# Patient Record
Sex: Male | Born: 1995 | Hispanic: No | Marital: Single | Smoking: Never smoker
Health system: Southern US, Community
[De-identification: ages and names within clinical notes are randomized; demographics above are authoritative.]

## PROBLEM LIST (undated history)

## (undated) DIAGNOSIS — J45909 Unspecified asthma, uncomplicated: Secondary | ICD-10-CM

---
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
## 2017-09-04 ENCOUNTER — Emergency Department (HOSPITAL_COMMUNITY): Payer: Self-pay

## 2017-09-04 ENCOUNTER — Encounter (HOSPITAL_COMMUNITY): Payer: Self-pay | Admitting: Emergency Medicine

## 2017-09-04 ENCOUNTER — Emergency Department (HOSPITAL_COMMUNITY)
Admission: EM | Admit: 2017-09-04 | Discharge: 2017-09-04 | Disposition: A | Discharge status: Home / Self Care | Payer: Self-pay | Attending: Emergency Medicine | Admitting: Emergency Medicine

## 2017-09-04 ENCOUNTER — Other Ambulatory Visit: Payer: Self-pay

## 2017-09-04 DIAGNOSIS — J181 Lobar pneumonia, unspecified organism: Secondary | ICD-10-CM

## 2017-09-04 DIAGNOSIS — J189 Pneumonia, unspecified organism: Secondary | ICD-10-CM | POA: Insufficient documentation

## 2017-09-04 DIAGNOSIS — J4521 Mild intermittent asthma with (acute) exacerbation: Secondary | ICD-10-CM | POA: Insufficient documentation

## 2017-09-04 MED ORDER — PREDNISONE 20 MG PO TABS: 60.0000 mg | ORAL_TABLET | Freq: Every day | ORAL | 0 refills | Status: DC

## 2017-09-04 MED ORDER — AZITHROMYCIN 250 MG PO TABS
500.0000 mg | ORAL_TABLET | Freq: Once | ORAL | Status: AC
  Administered 2017-09-04: 500 mg via ORAL
  Filled 2017-09-04: qty 2

## 2017-09-04 MED ORDER — ALBUTEROL SULFATE HFA 108 (90 BASE) MCG/ACT IN AERS
2.0000 | INHALATION_SPRAY | Freq: Once | RESPIRATORY_TRACT | Status: AC
  Administered 2017-09-04: 2 via RESPIRATORY_TRACT
  Filled 2017-09-04: qty 6.7

## 2017-09-04 MED ORDER — ALBUTEROL SULFATE (2.5 MG/3ML) 0.083% IN NEBU
5.0000 mg | INHALATION_SOLUTION | Freq: Once | RESPIRATORY_TRACT | Status: AC
  Administered 2017-09-04: 5 mg via RESPIRATORY_TRACT
  Filled 2017-09-04: qty 6

## 2017-09-04 MED ORDER — AZITHROMYCIN 250 MG PO TABS: 250.0000 mg | ORAL_TABLET | Freq: Every day | ORAL | 0 refills | Status: DC

## 2017-09-04 MED ORDER — PREDNISONE 20 MG PO TABS
60.0000 mg | ORAL_TABLET | Freq: Once | ORAL | Status: AC
Start: 2017-09-04 — End: 2017-09-04
  Administered 2017-09-04: 60 mg via ORAL
  Filled 2017-09-04: qty 3

## 2017-09-04 NOTE — ED Triage Notes (Signed)
Pt reports having SOB x 1 week worsening with seasonal allergies. Pt states he does not have a rescue inhaler at home. Audibly wheezing in triage.

## 2017-09-04 NOTE — ED Provider Notes (Signed)
TIME SEEN: 6:04 AM  CHIEF COMPLAINT: Shortness of breath, wheezing, cough  HPI: Patient is a 22 year old male with history of asthma who presents to the emergency department with a day of productive cough, shortness of breath, wheezing.  No fevers or chills.  Given breathing treatment in triage and states he is now feeling better.  No sick contacts or recent travel.  ROS: See HPI Constitutional: no fever  Eyes: no drainage  ENT: no runny nose   Cardiovascular:  no chest pain  Resp:  SOB  GI: no vomiting GU: no dysuria Integumentary: no rash  Allergy: no hives  Musculoskeletal: no leg swelling  Neurological: no slurred speech ROS otherwise negative  PAST MEDICAL HISTORY/PAST SURGICAL HISTORY:  History reviewed. No pertinent past medical history.  MEDICATIONS:  Prior to Admission medications   Medication Sig Start Date End Date Taking? Authorizing Provider  albuterol (PROVENTIL HFA;VENTOLIN HFA) 108 (90 BASE) MCG/ACT inhaler Inhale 2 puffs into the lungs every 6 (six) hours as needed for wheezing or shortness of breath. 08/18/14   Rodolph Bong, MD  predniSONE (DELTASONE) 50 MG tablet Take 1 tablet (50 mg total) by mouth daily. 08/18/14   Rodolph Bong, MD    ALLERGIES:  No Known Allergies  SOCIAL HISTORY:  Social History   Tobacco Use  . Smoking status: Never Smoker  . Smokeless tobacco: Never Used  Substance Use Topics  . Alcohol use: No    FAMILY HISTORY: No family history on file.  EXAM: BP 125/83 (BP Location: Left Arm)   Pulse 66   Temp 98.1 F (36.7 C) (Oral)   Resp 18   Ht 5\' 11"  (1.803 m)   Wt 77.1 kg (170 lb)   SpO2 99%   BMI 23.71 kg/m  CONSTITUTIONAL: Alert and oriented and responds appropriately to questions. Well-appearing; well-nourished HEAD: Normocephalic EYES: Conjunctivae clear, pupils appear equal, EOMI ENT: normal nose; moist mucous membranes NECK: Supple, no meningismus, no nuchal rigidity, no LAD  CARD: RRR; S1 and S2 appreciated; no  murmurs, no clicks, no rubs, no gallops RESP: Normal chest excursion without splinting or tachypnea; breath sounds clear and equal bilaterally; no wheezes, no rhonchi, no rales, no hypoxia or respiratory distress, speaking full sentences ABD/GI: Normal bowel sounds; non-distended; soft, non-tender, no rebound, no guarding, no peritoneal signs, no hepatosplenomegaly BACK:  The back appears normal and is non-tender to palpation, there is no CVA tenderness EXT: Normal ROM in all joints; non-tender to palpation; no edema; normal capillary refill; no cyanosis, no calf tenderness or swelling    SKIN: Normal color for age and race; warm; no rash NEURO: Moves all extremities equally PSYCH: The patient's mood and manner are appropriate. Grooming and personal hygiene are appropriate.  MEDICAL DECISION MAKING: Patient here with mild asthma exacerbation.  Given breathing treatment in triage and now lungs are clear and he is asymptomatic.  Chest x-rays was obtained that shows a mild medial right basilar pneumonia.  Will treat with azithromycin.  Will discharge with prednisone burst.  Given albuterol inhaler to take home and instructions on how to use it.  He is otherwise very well-appearing without hypoxia or respiratory distress.  I feel he is safe for discharge home.  Doubt ACS, PE or dissection.  EKG shows no ischemic abnormality.  Given outpatient PCP follow-up information.  At this time, I do not feel there is any life-threatening condition present. I have reviewed and discussed all results (EKG, imaging, lab, urine as appropriate) and exam findings with  patient/family. I have reviewed nursing notes and appropriate previous records.  I feel the patient is safe to be discharged home without further emergent workup and can continue workup as an outpatient as needed. Discussed usual and customary return precautions. Patient/family verbalize understanding and are comfortable with this plan.  Outpatient follow-up has  been provided if needed. All questions have been answered.      EKG Interpretation  Date/Time:  Wednesday September 04 2017 02:23:10 EDT Ventricular Rate:  97 PR Interval:  140 QRS Duration: 82 QT Interval:  344 QTC Calculation: 436 R Axis:   86 Text Interpretation:  Normal sinus rhythm Normal ECG No old tracing to compare Confirmed by Jonta Gastineau, Baxter HireKristen [phone]) on 09/04/2017 5:11:45 AM          Brianna Esson, Layla MawKristen N, DO 09/04/17 [id number]

## 2017-09-04 NOTE — Discharge Instructions (Signed)
You may use your albuterol inhaler 2-4 puffs every 2-4 hours as needed for shortness of breath and wheezing.  We have given you your first dose of steroids and antibiotics in the emergency department.  You may take your next dose the morning of April 18.  You may alternate Tylenol 1000 mg every 6 hours as needed for pain and fever and Ibuprofen 800 mg every 8 hours as needed for pain and fever.  Please take Ibuprofen with food.  I recommend that you drink between 60-80 ounces of water a day and rest.

## 2017-11-04 ENCOUNTER — Encounter (HOSPITAL_COMMUNITY): Payer: Self-pay | Admitting: Emergency Medicine

## 2017-11-04 ENCOUNTER — Emergency Department (HOSPITAL_COMMUNITY): Payer: No Typology Code available for payment source

## 2017-11-04 ENCOUNTER — Emergency Department (HOSPITAL_COMMUNITY)
Admission: EM | Admit: 2017-11-04 | Discharge: 2017-11-04 | Disposition: A | Discharge status: Home / Self Care | Payer: No Typology Code available for payment source | Attending: Emergency Medicine | Admitting: Emergency Medicine

## 2017-11-04 ENCOUNTER — Other Ambulatory Visit: Payer: Self-pay

## 2017-11-04 DIAGNOSIS — R6884 Jaw pain: Secondary | ICD-10-CM | POA: Insufficient documentation

## 2017-11-04 DIAGNOSIS — J45909 Unspecified asthma, uncomplicated: Secondary | ICD-10-CM | POA: Insufficient documentation

## 2017-11-04 MED ORDER — CYCLOBENZAPRINE HCL 10 MG PO TABS: 10.0000 mg | ORAL_TABLET | Freq: Two times a day (BID) | ORAL | 0 refills | Status: DC | PRN

## 2017-11-04 MED ORDER — IBUPROFEN 800 MG PO TABS: 800.0000 mg | ORAL_TABLET | Freq: Three times a day (TID) | ORAL | 0 refills | Status: DC

## 2017-11-04 NOTE — ED Notes (Signed)
Pt understood dc material. NAD noted. Script given at dc  
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
## 2018-08-22 ENCOUNTER — Other Ambulatory Visit: Payer: Self-pay

## 2018-08-22 ENCOUNTER — Encounter (HOSPITAL_COMMUNITY): Payer: Self-pay

## 2018-08-22 ENCOUNTER — Ambulatory Visit (HOSPITAL_COMMUNITY)
Admission: EM | Admit: 2018-08-22 | Discharge: 2018-08-22 | Disposition: A | Discharge status: Home / Self Care | Payer: BLUE CROSS/BLUE SHIELD | Attending: Family Medicine | Admitting: Family Medicine

## 2018-08-22 DIAGNOSIS — J45909 Unspecified asthma, uncomplicated: Secondary | ICD-10-CM

## 2018-08-22 MED ORDER — ALBUTEROL SULFATE HFA 108 (90 BASE) MCG/ACT IN AERS
1.0000 | INHALATION_SPRAY | RESPIRATORY_TRACT | 2 refills | Status: DC | PRN
Start: 2018-08-22 — End: 2020-01-20

## 2018-08-22 NOTE — ED Provider Notes (Signed)
[facility]    CSN: [id number] Arrival date & time: 08/22/18  1831     History   Chief Complaint No chief complaint on file.   HPI Scott Becker is a 23 y.o. male.   Pt complains of asthma.  Pt is out of albuterol.  Pt request rx for albuterol   The history is provided by the patient. No language interpreter was used.  Cough  Cough characteristics:  Non-productive Sputum characteristics:  Nondescript Severity:  Mild Onset quality:  Gradual Timing:  Constant Relieved by:  Nothing Worsened by:  Nothing Ineffective treatments:  None tried Associated symptoms: no shortness of breath and no sinus congestion     History reviewed. No pertinent past medical history.  Patient Active Problem List   Diagnosis Date Noted  . ASTHMA, INTERMITTENT, MODERATE 08/30/2008  . SHORTNESS OF BREATH 08/30/2008    History reviewed. No pertinent surgical history.     Home Medications    Prior to Admission medications   Medication Sig Start Date End Date Taking? Authorizing Provider  albuterol (PROVENTIL HFA;VENTOLIN HFA) 108 (90 Base) MCG/ACT inhaler Inhale 1 puff into the lungs every 4 (four) hours as needed for wheezing or shortness of breath. Prn 08/22/18   Elson Areas, PA-C  ibuprofen (ADVIL,MOTRIN) 800 MG tablet Take 1 tablet (800 mg total) by mouth 3 (three) times daily. 11/04/17   Roxy Horseman, PA-C    Family History History reviewed. No pertinent family history.  Social History Social History   Tobacco Use  . Smoking status: Never Smoker  . Smokeless tobacco: Never Used  Substance Use Topics  . Alcohol use: No  . Drug use: No     Allergies   Patient has no known allergies.   Review of Systems Review of Systems  Respiratory: Positive for cough. Negative for shortness of breath.   All other systems reviewed and are negative.    Physical Exam Triage Vital Signs ED Triage Vitals  Enc Vitals Group     BP 08/22/18 1847 121/81     Pulse  Rate 08/22/18 1847 91     Resp 08/22/18 1847 18     Temp 08/22/18 1847 98.3 F (36.8 C)     Temp src --      SpO2 08/22/18 1847 95 %     Weight --      Height --      Head Circumference --      Peak Flow --      Pain Score 08/22/18 1849 0     Pain Loc --      Pain Edu? --      Excl. in [place]? --    No data found.  Updated Vital Signs BP 121/81   Pulse 91   Temp 98.3 F (36.8 C)   Resp 18   SpO2 95%   Visual Acuity Right Eye Distance:   Left Eye Distance:   Bilateral Distance:    Right Eye Near:   Left Eye Near:    Bilateral Near:     Physical Exam Vitals signs and nursing note reviewed.  Constitutional:      Appearance: He is well-developed.  HENT:     Head: Normocephalic.     Right Ear: Tympanic membrane normal.     Left Ear: Tympanic membrane normal.     Mouth/Throat:     Mouth: Mucous membranes are moist.  Neck:     Musculoskeletal: Normal range of motion.  Cardiovascular:  Rate and Rhythm: Normal rate.  Pulmonary:     Breath sounds: Wheezing present.  Abdominal:     General: There is no distension.  Musculoskeletal: Normal range of motion.  Skin:    General: Skin is warm.  Neurological:     Mental Status: He is alert and oriented to person, place, and time.  Psychiatric:        Mood and Affect: Mood normal.      UC Treatments / Results  Labs (all labs ordered are listed, but only abnormal results are displayed) Labs Reviewed - No data to display  EKG None  Radiology No results found.  Procedures Procedures (including critical care time)  Medications Ordered in UC Medications - No data to display  Initial Impression / Assessment and Plan / UC Course  I have reviewed the triage vital signs and the nursing notes.  Pertinent labs & imaging results that were available during my care of the patient were reviewed by me and considered in my medical decision making (see chart for details).     Rx for albuterol  Final Clinical  Impressions(s) / UC Diagnoses   Final diagnoses:  Moderate asthma without complication, unspecified whether persistent     Discharge Instructions     Return if any problems.   ED Prescriptions    Medication Sig Dispense Auth. Provider   albuterol (PROVENTIL HFA;VENTOLIN HFA) 108 (90 Base) MCG/ACT inhaler Inhale 1 puff into the lungs every 4 (four) hours as needed for wheezing or shortness of breath. Prn 1 Inhaler Elson Areas, [place]     Controlled Substance Prescriptions [place] Controlled Substance Registry consulted? Not Applicable  An After Visit Summary was printed and given to the patient.    Elson Areas, [place] 08/22/18 1906

## 2018-08-22 NOTE — ED Triage Notes (Signed)
Needs refill for inhaler

## 2018-08-22 NOTE — Discharge Instructions (Addendum)
Return if any problems.

## 2019-02-23 ENCOUNTER — Other Ambulatory Visit: Payer: Self-pay

## 2019-02-23 ENCOUNTER — Ambulatory Visit (HOSPITAL_COMMUNITY)
Admission: EM | Admit: 2019-02-23 | Discharge: 2019-02-23 | Disposition: A | Discharge status: Home / Self Care | Payer: BC Managed Care – PPO | Attending: Emergency Medicine | Admitting: Emergency Medicine

## 2019-02-23 ENCOUNTER — Encounter (HOSPITAL_COMMUNITY): Payer: Self-pay

## 2019-02-23 DIAGNOSIS — J4520 Mild intermittent asthma, uncomplicated: Secondary | ICD-10-CM | POA: Insufficient documentation

## 2019-02-23 DIAGNOSIS — Z0189 Encounter for other specified special examinations: Secondary | ICD-10-CM | POA: Diagnosis present

## 2019-02-23 DIAGNOSIS — Z791 Long term (current) use of non-steroidal anti-inflammatories (NSAID): Secondary | ICD-10-CM | POA: Diagnosis not present

## 2019-02-23 DIAGNOSIS — Z113 Encounter for screening for infections with a predominantly sexual mode of transmission: Secondary | ICD-10-CM | POA: Diagnosis not present

## 2019-02-23 DIAGNOSIS — Z20828 Contact with and (suspected) exposure to other viral communicable diseases: Secondary | ICD-10-CM

## 2019-02-23 NOTE — ED Triage Notes (Addendum)
Pt wants to be covid tested. Pt states he wants to be STD tested as well. Pt states he has no symptoms at all.

## 2019-02-23 NOTE — Discharge Instructions (Addendum)
Your STD tests are pending.  If your test results are positive, we will call you.  You may need treatment and your partner may also need treatment.      Your COVID test is pending.  You should self quarantine until your test result is back and is negative.    Go to the emergency department if you develop high fever, shortness of breath, severe diarrhea, or other concerning symptoms.

## 2019-02-23 NOTE — ED Provider Notes (Signed)
[place]    CSN: [id number] Arrival date & time: 02/23/19  1534      History   Chief Complaint Chief Complaint  Patient presents with  . covid test  . SEXUALLY TRANSMITTED DISEASE    HPI Scott Becker is a 23 y.o. male.   Presents with request for a COVID test and STD testing.  Patient states he believes a previous sexual partner was positive for an STD.  He denies current symptoms but states he had a cough a couple of weeks ago.  He denies fever, chills, sore throat, current cough, shortness of breath, vomiting, diarrhea, penile discharge, dysuria, other symptoms.  The history is provided by the patient.    History reviewed. No pertinent past medical history.  Patient Active Problem List   Diagnosis Date Noted  . ASTHMA, INTERMITTENT, MODERATE 08/30/2008  . SHORTNESS OF BREATH 08/30/2008    History reviewed. No pertinent surgical history.     Home Medications    Prior to Admission medications   Medication Sig Start Date End Date Taking? Authorizing Provider  albuterol (PROVENTIL HFA;VENTOLIN HFA) 108 (90 Base) MCG/ACT inhaler Inhale 1 puff into the lungs every 4 (four) hours as needed for wheezing or shortness of breath. Prn 08/22/18   Fransico Meadow, PA-C  ibuprofen (ADVIL,MOTRIN) 800 MG tablet Take 1 tablet (800 mg total) by mouth 3 (three) times daily. 11/04/17   Montine Circle, PA-C    Family History History reviewed. No pertinent family history.  Social History Social History   Tobacco Use  . Smoking status: Never Smoker  . Smokeless tobacco: Never Used  Substance Use Topics  . Alcohol use: Yes  . Drug use: No     Allergies   Patient has no known allergies.   Review of Systems Review of Systems  Constitutional: Negative for chills and fever.  HENT: Negative for congestion, ear pain, rhinorrhea and sore throat.   Eyes: Negative for pain and visual disturbance.  Respiratory: Negative for cough and shortness of breath.    Cardiovascular: Negative for chest pain and palpitations.  Gastrointestinal: Negative for abdominal pain, diarrhea and vomiting.  Genitourinary: Negative for discharge, dysuria, flank pain, hematuria and testicular pain.  Musculoskeletal: Negative for arthralgias and back pain.  Skin: Negative for color change and rash.  Neurological: Negative for seizures and syncope.  All other systems reviewed and are negative.    Physical Exam Triage Vital Signs ED Triage Vitals  Enc Vitals Group     BP 02/23/19 1619 (!) 127/49     Pulse Rate 02/23/19 1619 69     Resp 02/23/19 1619 17     Temp 02/23/19 1619 97.8 F (36.6 C)     Temp Source 02/23/19 1619 Tympanic     SpO2 02/23/19 1619 100 %     Weight 02/23/19 1628 165 lb (74.8 kg)     Height --      Head Circumference --      Peak Flow --      Pain Score 02/23/19 1628 0     Pain Loc --      Pain Edu? --      Excl. in [place]? --    No data found.  Updated Vital Signs BP (!) 127/49 (BP Location: Left Arm)   Pulse 69   Temp 97.8 F (36.6 C) (Tympanic)   Resp 17   Wt 165 lb (74.8 kg)   SpO2 100%   BMI 23.01 kg/m   Visual Acuity  Right Eye Distance:   Left Eye Distance:   Bilateral Distance:    Right Eye Near:   Left Eye Near:    Bilateral Near:     Physical Exam Vitals signs and nursing note reviewed.  Constitutional:      Appearance: He is well-developed.  HENT:     Head: Normocephalic and atraumatic.     Right Ear: Tympanic membrane normal.     Left Ear: Tympanic membrane normal.     Nose: Nose normal.     Mouth/Throat:     Mouth: Mucous membranes are moist.     Pharynx: Oropharynx is clear.  Eyes:     Conjunctiva/sclera: Conjunctivae normal.  Neck:     Musculoskeletal: Neck supple.  Cardiovascular:     Rate and Rhythm: Normal rate and regular rhythm.     Heart sounds: No murmur.  Pulmonary:     Effort: Pulmonary effort is normal. No respiratory distress.     Breath sounds: Normal breath sounds.  Abdominal:      General: Bowel sounds are normal.     Palpations: Abdomen is soft.     Tenderness: There is no abdominal tenderness. There is no right CVA tenderness, left CVA tenderness, guarding or rebound.  Genitourinary:    Penis: Normal.      Scrotum/Testes: Normal.  Skin:    General: Skin is warm and dry.     Findings: No rash.  Neurological:     Mental Status: He is alert.      UC Treatments / Results  Labs (all labs ordered are listed, but only abnormal results are displayed) Labs Reviewed  NOVEL CORONAVIRUS, NAA (HOSP ORDER, SEND-OUT TO REF LAB; TAT 18-24 HRS)  HIV ANTIBODY (ROUTINE TESTING W REFLEX)  RPR  CYTOLOGY, (ORAL, ANAL, URETHRAL) ANCILLARY ONLY    EKG   Radiology No results found.  Procedures Procedures (including critical care time)  Medications Ordered in UC Medications - No data to display  Initial Impression / Assessment and Plan / UC Course  I have reviewed the triage vital signs and the nursing notes.  Pertinent labs & imaging results that were available during my care of the patient were reviewed by me and considered in my medical decision making (see chart for details).    Patient request for COVID test and for STD testing, including HIV and RPR.  Discussed with patient that he should refrain from sex until his STD test are back.  Discussed that if his test are positive we will call him and he may need treatment at that time.  COVID test performed here.  Instructed patient to self quarantine until his test results are back.  Instructed patient to go to the emergency department if he develops high fever, shortness of breath, severe diarrhea, or other concerning symptoms.  Patient agrees with plan of care.   Final Clinical Impressions(s) / UC Diagnoses   Final diagnoses:  Patient request for diagnostic testing     Discharge Instructions     Your STD tests are pending.  If your test results are positive, we will call you.  You may need treatment and your  partner may also need treatment.      Your COVID test is pending.  You should self quarantine until your test result is back and is negative.    Go to the emergency department if you develop high fever, shortness of breath, severe diarrhea, or other concerning symptoms.        ED Prescriptions  None     PDMP not reviewed this encounter.   Mickie Bailate, Thomasine Klutts H, NP 02/23/19 [phone]

## 2019-02-24 LAB — RPR: RPR Ser Ql: NONREACTIVE

## 2019-02-24 LAB — HIV ANTIBODY (ROUTINE TESTING W REFLEX): HIV Screen 4th Generation wRfx: NONREACTIVE

## 2019-02-25 LAB — CYTOLOGY, (ORAL, ANAL, URETHRAL) ANCILLARY ONLY
Chlamydia: NEGATIVE
Neisseria Gonorrhea: NEGATIVE
Trichomonas: NEGATIVE

## 2019-02-25 LAB — NOVEL CORONAVIRUS, NAA (HOSP ORDER, SEND-OUT TO REF LAB; TAT 18-24 HRS): SARS-CoV-2, NAA: NOT DETECTED

## 2019-08-27 ENCOUNTER — Other Ambulatory Visit: Payer: Self-pay

## 2019-08-27 ENCOUNTER — Ambulatory Visit: Payer: BC Managed Care – PPO | Admitting: Pediatrics

## 2019-08-27 VITALS — BP 136/78 | Ht 71.0 in | Wt 165.0 lb

## 2019-08-27 DIAGNOSIS — M67432 Ganglion, left wrist: Secondary | ICD-10-CM

## 2019-08-27 NOTE — Progress Notes (Signed)
  Scott Becker - 24 y.o. male MRN [id number]  Date of birth: 1996-04-22  SUBJECTIVE:   CC: bump on left wrist  24 year old man presenting with a bump on the dorsal aspect of his left wrist that has been present for the past 3 to 4 weeks and has been gradually getting larger in size.  He works at a [employer], dispensing into vans and notices the bump when he is extending his wrist while lifting a package or if he maxinally extends his wrist  orand it is sometimes causing pain.  No numbness or tingling. He would like it to be drained today if possible.   ROS: No unexpected weight loss, fever, chills, swelling, instability, muscle pain, numbness/tingling, redness, otherwise see HPI   PMHx - Updated and reviewed.  Contributory factors include: Negative PSHx - Updated and reviewed.  Contributory factors include:  Negative FHx - Updated and reviewed.  Contributory factors include:  Negative Social Hx - Updated and reviewed. Contributory factors include: Negative Medications - reviewed   Hand/wrist Exam Left wrist: -Inspection: ~1.5 x 1.5 cm cyst on dorsal aspect of wrist  -Palpation: non-tender to palpation -ROM: Normal range of motion with flexion, extension, radial deviation, ulnar deviation, pronation, supination. Some discomfort with maximal flexion and extension. -Strength: Flexion: 5/5; Extension: 5/5; Radial Deviation: 5/5; Ulnar Deviation: 5/5 -Limb neurovascularly intact  Contralateral Hand/wrist -Inspection: No deformity, no discoloration -Palpation: distal radius: non-tender; Distal ulna: non-tender; scaphoid: non-tender -ROM: Normal range of motion with flexion, extension, radial deviation, ulnar deviation, pronation, supination -Strength: Flexion: 5/5; Extension: 5/5; Radial Deviation: 5/5; Ulnar Deviation: 5/5 -Limb neurovascularly intact  DATA REVIEWED: PCP referral records   PHYSICAL EXAM:  VS: BP:136/78  HR: bpm  TEMP: ( )  RESP:   HT:5\' 11"  (180.3 cm)    WT:165 lb (74.8 kg)  BMI:23.02 PHYSICAL EXAM: Gen: NAD, alert, cooperative with exam, well-appearing HEENT: clear conjunctiva,  CV:  no edema, capillary refill brisk, normal rate Resp: non-labored Skin: no rashes, normal turgor  Neuro: no gross deficits.  Psych:  alert and oriented  ASSESSMENT & PLAN:  Ganglion cyst on dorsum of left wrist interfering with work and causing some discomfort.  Cyst was drained today under ultrasound guidance using 0.5 ml of lidocaine to numb the area and then draining with an 18 gauge needle; able to aspirate ~1 ml of gelatinous fluid. Patient had resolution of symptoms after drainage.  Coban was placed after drainage.  Discussed possibility of cyst recurring and offered a second drainage if this occurred.  If it recurs again after that we would have to discuss possibly surgical options.  Return as needed.  I was the preceptor for this visit and available for immediate consultation Marsa Aris, DO

## 2020-01-20 ENCOUNTER — Ambulatory Visit
Admission: EM | Admit: 2020-01-20 | Discharge: 2020-01-20 | Disposition: A | Discharge status: Home / Self Care | Payer: BC Managed Care – PPO | Attending: Emergency Medicine | Admitting: Emergency Medicine

## 2020-01-20 DIAGNOSIS — J4521 Mild intermittent asthma with (acute) exacerbation: Secondary | ICD-10-CM | POA: Diagnosis not present

## 2020-01-20 DIAGNOSIS — R0602 Shortness of breath: Secondary | ICD-10-CM

## 2020-01-20 HISTORY — DX: Unspecified asthma, uncomplicated: J45.909

## 2020-01-20 MED ORDER — PREDNISONE 50 MG PO TABS: 50.0000 mg | ORAL_TABLET | Freq: Every day | ORAL | 0 refills | Status: DC

## 2020-01-20 MED ORDER — ALBUTEROL SULFATE HFA 108 (90 BASE) MCG/ACT IN AERS: 1.0000 | INHALATION_SPRAY | RESPIRATORY_TRACT | 0 refills | Status: DC | PRN

## 2020-01-20 MED ORDER — AEROCHAMBER PLUS FLO-VU MEDIUM MISC: 1.0000 | Freq: Once | 0 refills | Status: AC

## 2020-01-20 MED ORDER — ALBUTEROL SULFATE HFA 108 (90 BASE) MCG/ACT IN AERS
2.0000 | INHALATION_SPRAY | Freq: Once | RESPIRATORY_TRACT | Status: AC
  Administered 2020-01-20: 2 via RESPIRATORY_TRACT

## 2020-01-20 MED ORDER — DEXAMETHASONE SODIUM PHOSPHATE 10 MG/ML IJ SOLN
10.0000 mg | Freq: Once | INTRAMUSCULAR | Status: AC
  Administered 2020-01-20: 10 mg via INTRAMUSCULAR

## 2020-01-20 NOTE — ED Provider Notes (Signed)
[facility]    CSN: [id number] Arrival date & time: 01/20/20  1807      History   Chief Complaint Chief Complaint  Patient presents with  . Asthma    HPI Scott Becker is a 24 y.o. male  Presenting for asthma exacerbation since today.  Patient Scott Becker wheezing, shortness of breath without prior fever, cough, nasal congestion.  No known sick contacts, did not get Covid vaccine.  Patient out of his inhaler which usually alleviates this.  Has not been hospitalized in the last year for asthma exacerbation.  Denying chest pain, palpitations, lower leg swelling, nausea.   Past Medical History:  Diagnosis Date  . Asthma     Patient Active Problem List   Diagnosis Date Noted  . ASTHMA, INTERMITTENT, MODERATE 08/30/2008  . SHORTNESS OF BREATH 08/30/2008    History reviewed. No pertinent surgical history.     Home Medications    Prior to Admission medications   Medication Sig Start Date End Date Taking? Authorizing Provider  albuterol (VENTOLIN HFA) 108 (90 Base) MCG/ACT inhaler Inhale 1 puff into the lungs every 4 (four) hours as needed for wheezing or shortness of breath. Prn 01/20/20   Hall-Potvin, [place], PA-C  ibuprofen (ADVIL,MOTRIN) 800 MG tablet Take 1 tablet (800 mg total) by mouth 3 (three) times daily. 11/04/17   Roxy Horseman, PA-C  predniSONE (DELTASONE) 50 MG tablet Take 1 tablet (50 mg total) by mouth daily with breakfast. 01/20/20   Hall-Potvin, [place], PA-C  Spacer/Aero-Holding Chambers (AEROCHAMBER PLUS FLO-VU MEDIUM) MISC 1 each by Other route once for 1 dose. 01/20/20 01/20/20  Hall-Potvin, [place], PA-C    Family History History reviewed. No pertinent family history.  Social History Social History   Tobacco Use  . Smoking status: Never Smoker  . Smokeless tobacco: Never Used  Substance Use Topics  . Alcohol use: Yes  . Drug use: No     Allergies   Patient has no known allergies.   Review of Systems As per HPI   Physical  Exam Triage Vital Signs ED Triage Vitals [01/20/20 1931]  Enc Vitals Group     BP 134/72     Pulse Rate 89     Resp 16     Temp (!) 97.4 F (36.3 C)     Temp Source Tympanic     SpO2 96 %     Weight 176 lb 4 oz (79.9 kg)     Height      Head Circumference      Peak Flow      Pain Score      Pain Loc      Pain Edu?      Excl. in [place]?    No data found.  Updated Vital Signs BP 134/72 (BP Location: Right Arm)   Pulse 89   Temp (!) 97.4 F (36.3 C) (Tympanic)   Resp 16   Wt 176 lb 4 oz (79.9 kg)   SpO2 96%   BMI 24.58 kg/m   Visual Acuity Right Eye Distance:   Left Eye Distance:   Bilateral Distance:    Right Eye Near:   Left Eye Near:    Bilateral Near:     Physical Exam Constitutional:      General: He is not in acute distress.    Appearance: He is not toxic-appearing or diaphoretic.  HENT:     Head: Normocephalic and atraumatic.     Mouth/Throat:     Mouth: Mucous membranes are  moist.     Pharynx: Oropharynx is clear.  Eyes:     General: No scleral icterus.    Conjunctiva/sclera: Conjunctivae normal.     Pupils: Pupils are equal, round, and reactive to light.  Neck:     Comments: Trachea midline, negative JVD Cardiovascular:     Rate and Rhythm: Normal rate and regular rhythm.  Pulmonary:     Effort: Pulmonary effort is normal. No respiratory distress.     Breath sounds: Wheezing and rhonchi present.     Comments: Scattered, diffuse.  No prolonged expiratory phase.  Good air entry bilaterally. Musculoskeletal:     Cervical back: Neck supple. No tenderness.  Lymphadenopathy:     Cervical: No cervical adenopathy.  Skin:    Capillary Refill: Capillary refill takes less than 2 seconds.     Coloration: Skin is not jaundiced or pale.     Findings: No rash.  Neurological:     Mental Status: He is alert and oriented to person, place, and time.      UC Treatments / Results  Labs (all labs ordered are listed, but only abnormal results are  displayed) Labs Reviewed  NOVEL CORONAVIRUS, NAA    EKG   Radiology No results found.  Procedures Procedures (including critical care time)  Medications Ordered in UC Medications  dexamethasone (DECADRON) injection 10 mg (10 mg Intramuscular Given 01/20/20 2013)  albuterol (VENTOLIN HFA) 108 (90 Base) MCG/ACT inhaler 2 puff (2 puffs Inhalation Given 01/20/20 2012)    Initial Impression / Assessment and Plan / UC Course  I have reviewed the triage vital signs and the nursing notes.  Pertinent labs & imaging results that were available during my care of the patient were reviewed by me and considered in my medical decision making (see chart for details).     Patient afebrile, nontoxic, with SpO2 96%.  Decadron albuterol given in office: Patient tolerated well.  Covid PCR pending.  Patient to quarantine until results are back.  We will treat supportively as outlined below.  Return precautions discussed, patient verbalized understanding and is agreeable to plan. Final Clinical Impressions(s) / UC Diagnoses   Final diagnoses:  SOB (shortness of breath)  Mild intermittent asthma with acute exacerbation     Discharge Instructions     Your COVID test is pending - it is important to quarantine / isolate at home until your results are back. If you test positive and would like further evaluation for persistent or worsening symptoms, you may schedule an E-visit or virtual (video) visit throughout the [facility] app or website.  PLEASE NOTE: If you develop severe chest pain or shortness of breath please go to the ER or call 9-1-1 for further evaluation --> DO NOT schedule electronic or virtual visits for this. Please call our office for further guidance / recommendations as needed.  For information about the Covid vaccine, please visit [email or web address]    ED Prescriptions    Medication Sig Dispense Auth. Provider   albuterol (VENTOLIN HFA) 108 (90 Base) MCG/ACT inhaler  Inhale 1 puff into the lungs every 4 (four) hours as needed for wheezing or shortness of breath. Prn 18 g Hall-Potvin, [place], PA-C   Spacer/Aero-Holding Chambers (AEROCHAMBER PLUS FLO-VU MEDIUM) MISC 1 each by Other route once for 1 dose. 1 each Hall-Potvin, [place], PA-C   predniSONE (DELTASONE) 50 MG tablet Take 1 tablet (50 mg total) by mouth daily with breakfast. 5 tablet Hall-Potvin, [place], PA-C     PDMP not reviewed this  encounter.   Hall-Potvin, [place], [place] 01/20/20 2021

## 2020-01-20 NOTE — ED Triage Notes (Signed)
Pt c/o asthma exacerbation today with SOB. States out of his inhaler. No distress noted

## 2020-01-20 NOTE — Discharge Instructions (Signed)
Your COVID test is pending - it is important to quarantine / isolate at home until your results are back. °If you test positive and would like further evaluation for persistent or worsening symptoms, you may schedule an E-visit or virtual (video) visit throughout the [facility] My[facility] app or website. ° °PLEASE NOTE: If you develop severe chest pain or shortness of breath please go to the ER or call 9-1-1 for further evaluation --> DO NOT schedule electronic or virtual visits for this. °Please call our office for further guidance / recommendations as needed. ° °For information about the Covid vaccine, please visit [email or web address] °

## 2020-01-23 LAB — NOVEL CORONAVIRUS, NAA: SARS-CoV-2, NAA: NOT DETECTED

## 2020-03-27 ENCOUNTER — Other Ambulatory Visit: Payer: Self-pay

## 2020-03-27 ENCOUNTER — Emergency Department (HOSPITAL_COMMUNITY)
Admission: EM | Admit: 2020-03-27 | Discharge: 2020-03-28 | Disposition: A | Discharge status: Home / Self Care | Payer: BC Managed Care – PPO | Attending: Emergency Medicine | Admitting: Emergency Medicine

## 2020-03-27 ENCOUNTER — Encounter (HOSPITAL_COMMUNITY): Payer: Self-pay

## 2020-03-27 DIAGNOSIS — Z5321 Procedure and treatment not carried out due to patient leaving prior to being seen by health care provider: Secondary | ICD-10-CM | POA: Insufficient documentation

## 2020-03-27 DIAGNOSIS — J45909 Unspecified asthma, uncomplicated: Secondary | ICD-10-CM | POA: Insufficient documentation

## 2020-03-27 MED ORDER — ALBUTEROL SULFATE HFA 108 (90 BASE) MCG/ACT IN AERS
2.0000 | INHALATION_SPRAY | Freq: Once | RESPIRATORY_TRACT | Status: AC
  Administered 2020-03-27: 2 via RESPIRATORY_TRACT
  Filled 2020-03-27: qty 6.7

## 2020-03-27 NOTE — ED Triage Notes (Signed)
Pt states that he has asthma and is unable to find his rescue inhaler, audible wheezing heard.

## 2020-03-28 NOTE — ED Notes (Signed)
Patient stated "I cannot wait 5 hours, I have a time limit, I did not drive my own car here I am going to leave." LWBS moving OTF.

## 2020-03-31 IMAGING — DX DG CHEST 2V
2 series · 2 of 2 positions shown · non-contrast
Comparison: Chest radiograph performed 10/07/2003

CLINICAL DATA: Acute onset of shortness of breath.  Wheezing.

EXAM:
CHEST - 2 VIEW

[chest pa]
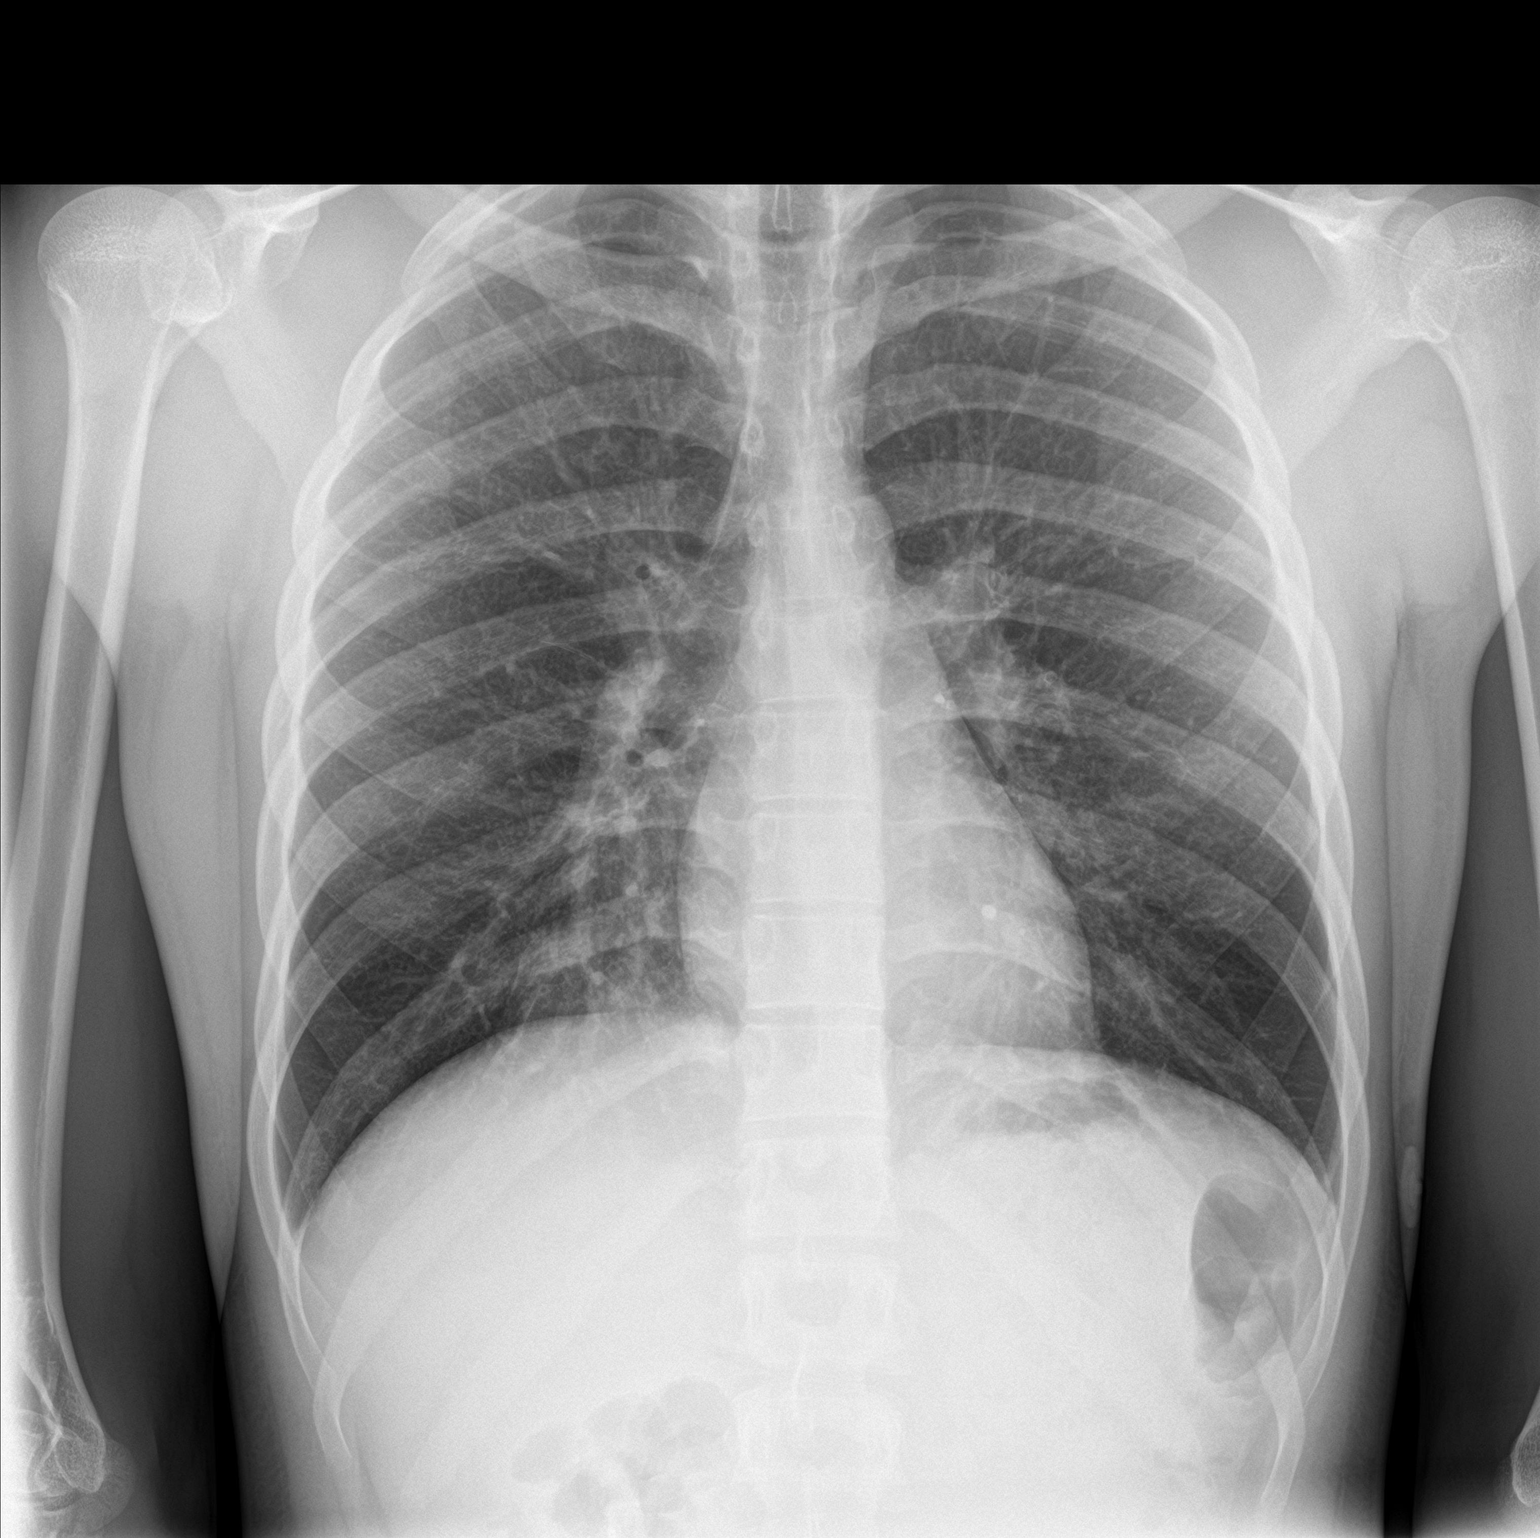

[chest lat]
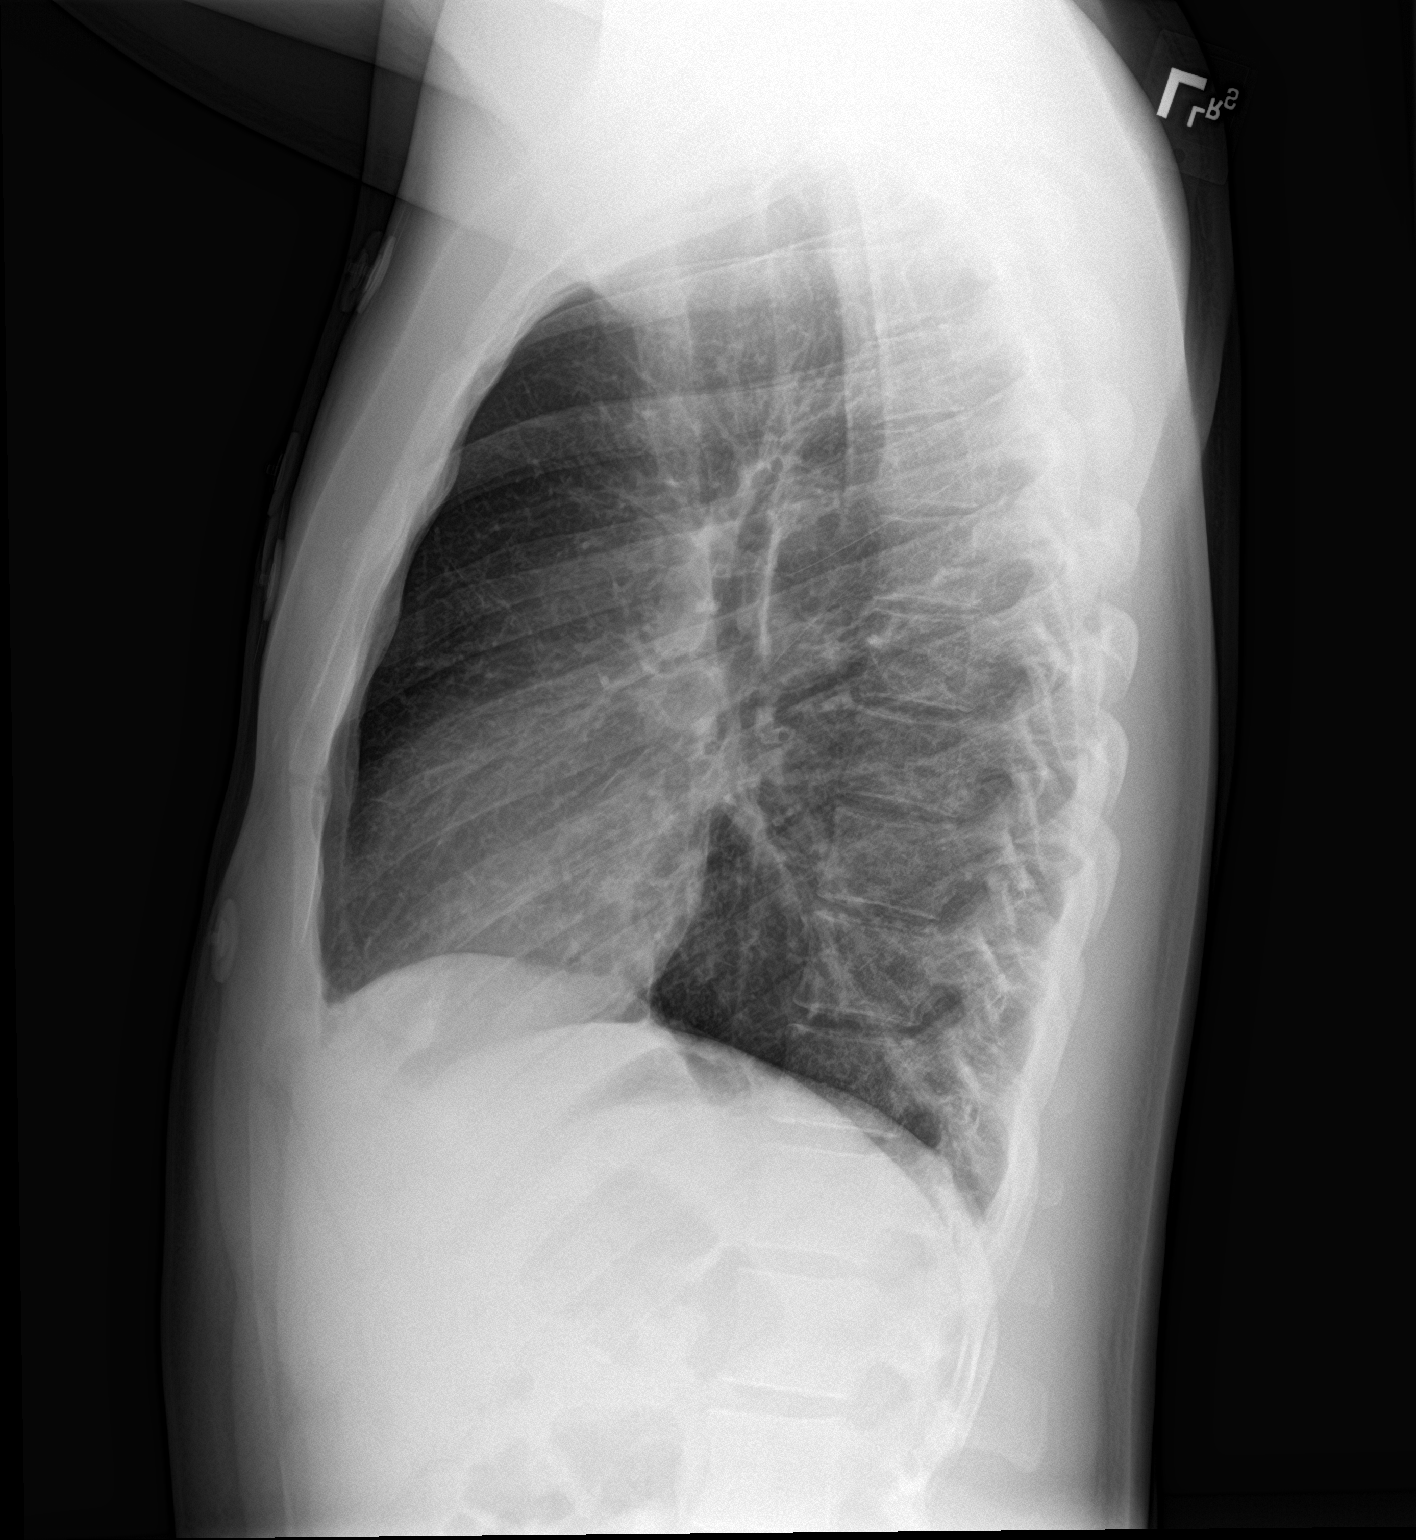

[2 of 2 positions shown; findings below may reference images not displayed]

FINDINGS: The lungs are well-aerated. Mild medial right basilar airspace
opacity is concerning for pneumonia. There is no evidence of pleural
effusion or pneumothorax.

The heart is normal in size; the mediastinal contour is within
normal limits. No acute osseous abnormalities are seen.
IMPRESSION: Mild medial right basilar pneumonia.

## 2021-02-28 ENCOUNTER — Ambulatory Visit
Admission: EM | Admit: 2021-02-28 | Discharge: 2021-02-28 | Disposition: A | Discharge status: Home / Self Care | Payer: Self-pay | Attending: Physician Assistant | Admitting: Physician Assistant

## 2021-02-28 DIAGNOSIS — J45909 Unspecified asthma, uncomplicated: Secondary | ICD-10-CM

## 2021-02-28 MED ORDER — ALBUTEROL SULFATE HFA 108 (90 BASE) MCG/ACT IN AERS
2.0000 | INHALATION_SPRAY | Freq: Once | RESPIRATORY_TRACT | Status: AC
  Administered 2021-02-28: 2 via RESPIRATORY_TRACT

## 2021-02-28 MED ORDER — ALBUTEROL SULFATE HFA 108 (90 BASE) MCG/ACT IN AERS: 1.0000 | INHALATION_SPRAY | Freq: Four times a day (QID) | RESPIRATORY_TRACT | 0 refills | Status: DC | PRN

## 2021-02-28 NOTE — ED Triage Notes (Signed)
Pt c/o SOB onset today, cough. Denies nasal congestion, headache, body aches chills nausea vomiting diarrhea constipation. Audible wheeze on initial assessment. States he does not have refills on his albuterol inhaler.

## 2021-02-28 NOTE — ED Provider Notes (Signed)
[facility]    CSN: [id number] Arrival date & time: 02/28/21  0907      History   Chief Complaint Chief Complaint  Patient presents with   Shortness of Breath    HPI Scott Becker is a 25 y.o. male.   Patient here today for evaluation of shortness of breath, and wheeze that started today.  He has known asthma but ran out of his albuterol inhaler.  He denies any fever or chills.  He has not had any ear pain, sore throat, nasal congestion, nausea, vomiting, or diarrhea.  He has not tried any other treatment for symptoms.  The history is provided by the patient.  Shortness of Breath Associated symptoms: cough   Associated symptoms: no abdominal pain, no ear pain, no fever, no sore throat and no vomiting    Past Medical History:  Diagnosis Date   Asthma     Patient Active Problem List   Diagnosis Date Noted   ASTHMA, INTERMITTENT, MODERATE 08/30/2008   SHORTNESS OF BREATH 08/30/2008    History reviewed. No pertinent surgical history.     Home Medications    Prior to Admission medications   Medication Sig Start Date End Date Taking? Authorizing Provider  albuterol (VENTOLIN HFA) 108 (90 Base) MCG/ACT inhaler Inhale 1-2 puffs into the lungs every 6 (six) hours as needed for wheezing or shortness of breath. 02/28/21  Yes Tomi Bamberger, PA-C  ibuprofen (ADVIL,MOTRIN) 800 MG tablet Take 1 tablet (800 mg total) by mouth 3 (three) times daily. 11/04/17   Roxy Horseman, PA-C  predniSONE (DELTASONE) 50 MG tablet Take 1 tablet (50 mg total) by mouth daily with breakfast. 01/20/20   Hall-Potvin, [place], PA-C    Family History History reviewed. No pertinent family history.  Social History Social History   Tobacco Use   Smoking status: Never   Smokeless tobacco: Never  Substance Use Topics   Alcohol use: Yes   Drug use: No     Allergies   Patient has no known allergies.   Review of Systems Review of Systems  Constitutional:  Negative for  chills and fever.  HENT:  Negative for congestion, ear pain and sore throat.   Eyes:  Negative for discharge and redness.  Respiratory:  Positive for cough and shortness of breath.   Gastrointestinal:  Negative for abdominal pain, diarrhea, nausea and vomiting.    Physical Exam Triage Vital Signs ED Triage Vitals  Enc Vitals Group     BP 02/28/21 0927 110/75     Pulse Rate 02/28/21 0927 86     Resp 02/28/21 0927 18     Temp 02/28/21 0927 98 F (36.7 C)     Temp Source 02/28/21 0927 Oral     SpO2 02/28/21 0927 96 %     Weight --      Height --      Head Circumference --      Peak Flow --      Pain Score 02/28/21 0929 0     Pain Loc --      Pain Edu? --      Excl. in GC? --    No data found.  Updated Vital Signs BP 110/75 (BP Location: Left Arm)   Pulse 86   Temp 98 F (36.7 C) (Oral)   Resp 18   SpO2 96%   Physical Exam Vitals and nursing note reviewed.  Constitutional:      General: He is not in acute distress.  Appearance: He is well-developed. He is not ill-appearing.  HENT:     Head: Normocephalic and atraumatic.     Right Ear: Tympanic membrane normal.     Left Ear: Tympanic membrane normal.     Nose: Nose normal.     Mouth/Throat:     Mouth: Mucous membranes are moist.     Pharynx: Oropharynx is clear. No pharyngeal swelling or oropharyngeal exudate.  Eyes:     Conjunctiva/sclera: Conjunctivae normal.  Cardiovascular:     Rate and Rhythm: Normal rate and regular rhythm.     Heart sounds: Normal heart sounds. No murmur heard. Pulmonary:     Effort: Pulmonary effort is normal. No respiratory distress.     Breath sounds: Normal breath sounds. No wheezing, rhonchi or rales.  Skin:    General: Skin is warm and dry.  Neurological:     Mental Status: He is alert.  Psychiatric:        Mood and Affect: Mood normal.        Behavior: Behavior normal.     UC Treatments / Results  Labs (all labs ordered are listed, but only abnormal results are  displayed) Labs Reviewed - No data to display  EKG   Radiology No results found.  Procedures Procedures (including critical care time)  Medications Ordered in UC Medications  albuterol (VENTOLIN HFA) 108 (90 Base) MCG/ACT inhaler 2 puff (2 puffs Inhalation Given 02/28/21 0933)    Initial Impression / Assessment and Plan / UC Course  I have reviewed the triage vital signs and the nursing notes.  Pertinent labs & imaging results that were available during my care of the patient were reviewed by me and considered in my medical decision making (see chart for details).  Suspect likely asthma flare, and albuterol administered in office with significant improvement of wheezing and shortness of breath.  Prescription sent in for same.  Discussed follow-up with a primary care provider, and explained how to establish care with same.  Information given in discharge instructions.  Encouraged follow-up with any further concerns.  Final Clinical Impressions(s) / UC Diagnoses   Final diagnoses:  Moderate asthma, unspecified whether complicated, unspecified whether persistent   Discharge Instructions   None    ED Prescriptions     Medication Sig Dispense Auth. Provider   albuterol (VENTOLIN HFA) 108 (90 Base) MCG/ACT inhaler Inhale 1-2 puffs into the lungs every 6 (six) hours as needed for wheezing or shortness of breath. 8 g Tomi Bamberger, PA-C      PDMP not reviewed this encounter.   Tomi Bamberger, PA-C 02/28/21 [phone]

## 2022-09-04 ENCOUNTER — Encounter (HOSPITAL_COMMUNITY): Payer: Self-pay

## 2022-09-04 ENCOUNTER — Ambulatory Visit (HOSPITAL_COMMUNITY)
Admission: EM | Admit: 2022-09-04 | Discharge: 2022-09-04 | Disposition: A | Discharge status: Home / Self Care | Payer: Medicaid Other | Attending: Family Medicine | Admitting: Family Medicine

## 2022-09-04 DIAGNOSIS — J4521 Mild intermittent asthma with (acute) exacerbation: Secondary | ICD-10-CM | POA: Diagnosis not present

## 2022-09-04 MED ORDER — PREDNISONE 20 MG PO TABS: 40.0000 mg | ORAL_TABLET | Freq: Every day | ORAL | 0 refills | Status: AC

## 2022-09-04 MED ORDER — ALBUTEROL SULFATE HFA 108 (90 BASE) MCG/ACT IN AERS: 2.0000 | INHALATION_SPRAY | RESPIRATORY_TRACT | 0 refills | Status: DC | PRN

## 2022-09-04 NOTE — ED Provider Notes (Signed)
[facility]    CSN: [id number] Arrival date & time: 09/04/22  0815      History   Chief Complaint Chief Complaint  Patient presents with   Asthma    HPI Scott Becker is a 27 y.o. male.    Asthma   Here for shortness of breath that began bothering him last night.  He has wheeze and it is consistent with his asthma.  His asthma often bothers him more in the spring with the pollen.  He has not had any fever or nasal congestion or sore throat.    Past Medical History:  Diagnosis Date   Asthma     Patient Active Problem List   Diagnosis Date Noted   ASTHMA, INTERMITTENT, MODERATE 08/30/2008   SHORTNESS OF BREATH 08/30/2008    History reviewed. No pertinent surgical history.     Home Medications    Prior to Admission medications   Medication Sig Start Date End Date Taking? Authorizing Provider  albuterol (VENTOLIN HFA) 108 (90 Base) MCG/ACT inhaler Inhale 2 puffs into the lungs every 4 (four) hours as needed for wheezing or shortness of breath. 09/04/22  Yes Zenia Resides, MD  predniSONE (DELTASONE) 20 MG tablet Take 2 tablets (40 mg total) by mouth daily with breakfast for 5 days. 09/04/22 09/09/22 Yes Oseias Horsey, Janace Aris, MD    Family History Family History  Family history unknown: Yes    Social History Social History   Tobacco Use   Smoking status: Never   Smokeless tobacco: Never  Vaping Use   Vaping Use: Never used  Substance Use Topics   Alcohol use: Yes   Drug use: No     Allergies   Patient has no known allergies.   Review of Systems Review of Systems   Physical Exam Triage Vital Signs ED Triage Vitals [09/04/22 0859]  Enc Vitals Group     BP 131/83     Pulse Rate 82     Resp 16     Temp 97.8 F (36.6 C)     Temp Source Oral     SpO2 92 %     Weight      Height      Head Circumference      Peak Flow      Pain Score 0     Pain Loc      Pain Edu?      Excl. in GC?    No data found.  Updated Vital  Signs BP 131/83 (BP Location: Right Arm)   Pulse 82   Temp 97.8 F (36.6 C) (Oral)   Resp 16   SpO2 92%   Visual Acuity Right Eye Distance:   Left Eye Distance:   Bilateral Distance:    Right Eye Near:   Left Eye Near:    Bilateral Near:     Physical Exam Vitals reviewed.  Constitutional:      General: He is not in acute distress.    Appearance: He is not ill-appearing, toxic-appearing or diaphoretic.  HENT:     Nose: Nose normal.     Mouth/Throat:     Mouth: Mucous membranes are moist.     Pharynx: No oropharyngeal exudate or posterior oropharyngeal erythema.  Eyes:     Extraocular Movements: Extraocular movements intact.     Conjunctiva/sclera: Conjunctivae normal.     Pupils: Pupils are equal, round, and reactive to light.  Cardiovascular:     Rate and Rhythm: Normal rate and regular  rhythm.     Heart sounds: No murmur heard. Pulmonary:     Effort: No respiratory distress.     Breath sounds: No stridor. No rhonchi or rales.     Comments: There are just a few end expiratory wheezes heard bilaterally in the lower lung fields.  Overall air movement is good  Musculoskeletal:     Cervical back: Neck supple.  Lymphadenopathy:     Cervical: No cervical adenopathy.  Skin:    Coloration: Skin is not jaundiced or pale.  Neurological:     Mental Status: He is alert and oriented to person, place, and time.  Psychiatric:        Behavior: Behavior normal.      UC Treatments / Results  Labs (all labs ordered are listed, but only abnormal results are displayed) Labs Reviewed - No data to display  EKG   Radiology No results found.  Procedures Procedures (including critical care time)  Medications Ordered in UC Medications - No data to display  Initial Impression / Assessment and Plan / UC Course  I have reviewed the triage vital signs and the nursing notes.  Pertinent labs & imaging results that were available during my care of the patient were reviewed by  me and considered in my medical decision making (see chart for details).        Albuterol inhaler and short burst of prednisone are sent in to treat his asthma exacerbation Final Clinical Impressions(s) / UC Diagnoses   Final diagnoses:  Mild intermittent asthma with acute exacerbation     Discharge Instructions      Albuterol inhaler--do 2 puffs every 4 hours as needed for shortness of breath or wheezing  Take prednisone 20 mg--2 daily for 5 days       ED Prescriptions     Medication Sig Dispense Auth. Provider   albuterol (VENTOLIN HFA) 108 (90 Base) MCG/ACT inhaler Inhale 2 puffs into the lungs every 4 (four) hours as needed for wheezing or shortness of breath. 1 each Zenia Resides, MD   predniSONE (DELTASONE) 20 MG tablet Take 2 tablets (40 mg total) by mouth daily with breakfast for 5 days. 10 tablet Marlinda Mike Janace Aris, MD      PDMP not reviewed this encounter.   Zenia Resides, MD 09/04/22 [phone]

## 2022-09-04 NOTE — Discharge Instructions (Signed)
Albuterol inhaler--do 2 puffs every 4 hours as needed for shortness of breath or wheezing  Take prednisone 20 mg--2 daily for 5 days  

## 2022-09-04 NOTE — ED Triage Notes (Signed)
Patient reports that he has had SOB/asthma since yesterday. Patient states was worse during the night. Patient states he does not have an Albuterol inhaler and has ben out for a few months.

## 2022-11-06 ENCOUNTER — Ambulatory Visit (HOSPITAL_COMMUNITY)
Admission: EM | Admit: 2022-11-06 | Discharge: 2022-11-06 | Disposition: A | Discharge status: Home / Self Care | Payer: Medicaid Other | Attending: Emergency Medicine | Admitting: Emergency Medicine

## 2022-11-06 ENCOUNTER — Other Ambulatory Visit: Payer: Self-pay

## 2022-11-06 ENCOUNTER — Encounter (HOSPITAL_COMMUNITY): Payer: Self-pay | Admitting: Emergency Medicine

## 2022-11-06 DIAGNOSIS — J4520 Mild intermittent asthma, uncomplicated: Secondary | ICD-10-CM

## 2022-11-06 DIAGNOSIS — Z760 Encounter for issue of repeat prescription: Secondary | ICD-10-CM | POA: Diagnosis not present

## 2022-11-06 MED ORDER — ALBUTEROL SULFATE HFA 108 (90 BASE) MCG/ACT IN AERS: 2.0000 | INHALATION_SPRAY | RESPIRATORY_TRACT | 0 refills | Status: DC | PRN

## 2022-11-06 NOTE — ED Triage Notes (Signed)
Pt states he ran out his inhaler for asthma and is requesting a refill.

## 2022-11-06 NOTE — Discharge Instructions (Addendum)
I have refilled your albuterol inhaler today.  Our staff will help set you up with a primary care provider for further evaluation and management of your asthma.  Please return to clinic for any new or concerning symptoms.

## 2022-11-06 NOTE — ED Provider Notes (Signed)
[facility]    CSN: [id number] Arrival date & time: 11/06/22  1417      History   Chief Complaint Chief Complaint  Patient presents with   Medication Refill    Pt states he ran out his inhaler for asthma and is requesting a refill.    HPI Scott Becker is a 27 y.o. male.   Patient presents to clinic requesting a refill of his albuterol inhaler.  He has been using it about twice daily, reports he has good control of his symptoms after he uses his inhaler.  He denies any persistent wheezing, shortness of breath or fevers.  Does not smoke.  Does not currently have a PCP.    The history is provided by the patient and medical records.  Medication Refill   Past Medical History:  Diagnosis Date   Asthma     Patient Active Problem List   Diagnosis Date Noted   ASTHMA, INTERMITTENT, MODERATE 08/30/2008   SHORTNESS OF BREATH 08/30/2008    History reviewed. No pertinent surgical history.     Home Medications    Prior to Admission medications   Medication Sig Start Date End Date Taking? Authorizing Provider  albuterol (VENTOLIN HFA) 108 (90 Base) MCG/ACT inhaler Inhale 2 puffs into the lungs every 4 (four) hours as needed for wheezing or shortness of breath. 11/06/22   Bonnie Roig, [place] N, FNP    Family History Family History  Family history unknown: Yes    Social History Social History   Tobacco Use   Smoking status: Never   Smokeless tobacco: Never  Vaping Use   Vaping Use: Never used  Substance Use Topics   Alcohol use: Yes   Drug use: No     Allergies   Patient has no known allergies.   Review of Systems Review of Systems  Constitutional:  Negative for fever.  Respiratory:  Negative for cough, shortness of breath and wheezing.   Cardiovascular:  Negative for chest pain.     Physical Exam Triage Vital Signs ED Triage Vitals  Enc Vitals Group     BP 11/06/22 1429 (!) 140/75     Pulse Rate 11/06/22 1429 91     Resp  11/06/22 1429 18     Temp 11/06/22 1429 98 F (36.7 C)     Temp Source 11/06/22 1429 Oral     SpO2 11/06/22 1429 96 %     Weight 11/06/22 1430 169 lb 15.6 oz (77.1 kg)     Height 11/06/22 1430 5\' 11"  (1.803 m)     Head Circumference --      Peak Flow --      Pain Score --      Pain Loc --      Pain Edu? --      Excl. in GC? --    No data found.  Updated Vital Signs BP (!) 140/75 (BP Location: Right Arm)   Pulse 91   Temp 98 F (36.7 C) (Oral)   Resp 18   Ht 5\' 11"  (1.803 m)   Wt 169 lb 15.6 oz (77.1 kg)   SpO2 96%   BMI 23.71 kg/m   Visual Acuity Right Eye Distance:   Left Eye Distance:   Bilateral Distance:    Right Eye Near:   Left Eye Near:    Bilateral Near:     Physical Exam Vitals and nursing note reviewed.  Constitutional:      Appearance: Normal appearance.  HENT:  Head: Normocephalic and atraumatic.     Right Ear: External ear normal.     Left Ear: External ear normal.     Nose: Nose normal.     Mouth/Throat:     Mouth: Mucous membranes are moist.  Eyes:     Conjunctiva/sclera: Conjunctivae normal.  Cardiovascular:     Rate and Rhythm: Normal rate and regular rhythm.     Heart sounds: Normal heart sounds. No murmur heard. Pulmonary:     Effort: Pulmonary effort is normal. No respiratory distress.     Breath sounds: Normal breath sounds.  Musculoskeletal:        General: Normal range of motion.  Skin:    General: Skin is warm and dry.  Neurological:     General: No focal deficit present.     Mental Status: He is alert.  Psychiatric:        Mood and Affect: Mood normal.      UC Treatments / Results  Labs (all labs ordered are listed, but only abnormal results are displayed) Labs Reviewed - No data to display  EKG   Radiology No results found.  Procedures Procedures (including critical care time)  Medications Ordered in UC Medications - No data to display  Initial Impression / Assessment and Plan / UC Course  I have  reviewed the triage vital signs and the nursing notes.  Pertinent labs & imaging results that were available during my care of the patient were reviewed by me and considered in my medical decision making (see chart for details).  Vitals and triage reviewed, patient is hemodynamically stable.  Lungs are vesicular posteriorly, oxygenation 96%.  Will refill albuterol inhaler, staff to schedule with PCP for further evaluation and management of asthma.  Patient does not appear to be in acute exacerbation, will withhold antibiotics or oral steroids.  Plan of care, follow-up care and return precautions given, no questions at this time.     Final Clinical Impressions(s) / UC Diagnoses   Final diagnoses:  Medication refill  Mild intermittent asthma in adult without complication     Discharge Instructions      I have refilled your albuterol inhaler today.  Our staff will help set you up with a primary care provider for further evaluation and management of your asthma.  Please return to clinic for any new or concerning symptoms.     ED Prescriptions     Medication Sig Dispense Auth. Provider   albuterol (VENTOLIN HFA) 108 (90 Base) MCG/ACT inhaler Inhale 2 puffs into the lungs every 4 (four) hours as needed for wheezing or shortness of breath. 1 each Bennette Hasty, [place] N, FNP      PDMP not reviewed this encounter.   Marilene Vath, [place] N, [place] 11/06/22 [phone]

## 2022-12-12 ENCOUNTER — Ambulatory Visit (INDEPENDENT_AMBULATORY_CARE_PROVIDER_SITE_OTHER): Payer: Medicaid Other | Admitting: Family Medicine

## 2022-12-12 ENCOUNTER — Encounter: Payer: Self-pay | Admitting: Family Medicine

## 2022-12-12 VITALS — BP 108/72 | HR 81 | Temp 98.3°F | Ht 70.5 in | Wt 222.5 lb

## 2022-12-12 DIAGNOSIS — R002 Palpitations: Secondary | ICD-10-CM | POA: Diagnosis not present

## 2022-12-12 DIAGNOSIS — J4540 Moderate persistent asthma, uncomplicated: Secondary | ICD-10-CM | POA: Insufficient documentation

## 2022-12-12 LAB — CBC WITH DIFFERENTIAL/PLATELET
Basophils Absolute: 0 10*3/uL (ref 0.0–0.1)
Basophils Relative: 0.6 % (ref 0.0–3.0)
Eosinophils Absolute: 0.2 10*3/uL (ref 0.0–0.7)
Eosinophils Relative: 3.3 % (ref 0.0–5.0)
HCT: 44.1 % (ref 39.0–52.0)
Hemoglobin: 14.1 g/dL (ref 13.0–17.0)
Lymphocytes Relative: 31.1 % (ref 12.0–46.0)
Lymphs Abs: 2.3 10*3/uL (ref 0.7–4.0)
MCHC: 31.9 g/dL (ref 30.0–36.0)
MCV: 81.1 fl (ref 78.0–100.0)
Monocytes Absolute: 0.6 10*3/uL (ref 0.1–1.0)
Monocytes Relative: 8.4 % (ref 3.0–12.0)
Neutro Abs: 4.1 10*3/uL (ref 1.4–7.7)
Neutrophils Relative %: 56.6 % (ref 43.0–77.0)
Platelets: 416 10*3/uL — ABNORMAL HIGH (ref 150.0–400.0)
RBC: 5.44 Mil/uL (ref 4.22–5.81)
RDW: 14.4 % (ref 11.5–15.5)
WBC: 7.3 10*3/uL (ref 4.0–10.5)

## 2022-12-12 LAB — COMPREHENSIVE METABOLIC PANEL
ALT: 53 U/L (ref 0–53)
AST: 34 U/L (ref 0–37)
Albumin: 4.3 g/dL (ref 3.5–5.2)
Alkaline Phosphatase: 39 U/L (ref 39–117)
BUN: 11 mg/dL (ref 6–23)
CO2: 27 mEq/L (ref 19–32)
Calcium: 9.6 mg/dL (ref 8.4–10.5)
Chloride: 103 mEq/L (ref 96–112)
Creatinine, Ser: 0.96 mg/dL (ref 0.40–1.50)
GFR: 108.44 mL/min (ref >60.00)
Glucose, Bld: 105 mg/dL — ABNORMAL HIGH (ref 70–99)
Potassium: 3.6 mEq/L (ref 3.5–5.1)
Sodium: 139 mEq/L (ref 135–145)
Total Bilirubin: 0.4 mg/dL (ref 0.2–1.2)
Total Protein: 7.4 g/dL (ref 6.0–8.3)

## 2022-12-12 LAB — TSH: TSH: 1.95 u[IU]/mL (ref 0.35–5.50)

## 2022-12-12 MED ORDER — BUDESONIDE-FORMOTEROL FUMARATE 160-4.5 MCG/ACT IN AERO
2.0000 | INHALATION_SPRAY | Freq: Two times a day (BID) | RESPIRATORY_TRACT | 5 refills | Status: DC
Start: 2022-12-12 — End: 2024-02-24

## 2022-12-12 NOTE — Assessment & Plan Note (Signed)
Normal rate and rhythm on auscultation today, likely due to albuterol use, will order labs to rule out other causes.

## 2022-12-12 NOTE — Assessment & Plan Note (Signed)
Concern is that his asthma worsens with exercise, I recommended starting a daily controller medication to help with his breathing, rx for symbicort sent to pharmacy.

## 2022-12-12 NOTE — Progress Notes (Signed)
New Patient Office Visit  Subjective    Patient ID: Scott Becker, male    DOB: 08-13-1995  Age: 27 y.o. MRN: [id number]  CC:  Chief Complaint  Patient presents with   Establish Care    HPI Scott Becker presents to establish care Patient did not have a previous PCP, states that he is feeling fine, but reports that his HR is going faster than normal, no SOB or dizziness. No chest pain, states he just feels like he is more aware of it. Hasn't been going on for very long, can be very random. States that he doesn't think it is beating faster he is just more aware of the heart beat.   Pt reports a history of asthma, states that he is using his inhaler a couple times a week, no chronic coughing or difficulty with breathing, no allergy symptoms. Non smoker, does not have any allergy symptoms or take anything over the counter.he reports that he gets worsening symptoms when he exercises, states that he has had to limit his exercise due to breathing issues.   Pt denies any other medical problems he has been diagnosed with at this time. I have reviewed all aspects of the patient's medical history including social, family, and surgical history.   Current Outpatient Medications  Medication Instructions   albuterol (VENTOLIN HFA) 108 (90 Base) MCG/ACT inhaler 2 puffs, Inhalation, Every 4 hours PRN   budesonide-formoterol (SYMBICORT) 160-4.5 MCG/ACT inhaler 2 puffs, Inhalation, 2 times daily    Past Medical History:  Diagnosis Date   Asthma     History reviewed. No pertinent surgical history.  Family History  Problem Relation Age of Onset   Diabetes Sister    Asthma Maternal Grandmother     Social History   Socioeconomic History   Marital status: Single    Spouse name: Not on file   Number of children: Not on file   Years of education: Not on file   Highest education level: Not on file  Occupational History   Not on file  Tobacco Use   Smoking status: Never    Smokeless tobacco: Never  Vaping Use   Vaping status: Never Used  Substance and Sexual Activity   Alcohol use: Yes    Comment: minimal   Drug use: Not Currently    Types: Marijuana   Sexual activity: Yes  Other Topics Concern   Not on file  Social History Narrative   Not on file   Social Determinants of Health   Financial Resource Strain: Not on File (09/07/2021)   Received from [employer], [employer]    Financial Resource Strain: 0  Food Insecurity: Not on File (09/07/2021)   Received from [place], [facility] Insecurity    Food: 0  Transportation Needs: Not on File (09/07/2021)   Received from [employer] [facility], [employer] Needs    Transportation: 0  Physical Activity: Not on File (09/07/2021)   Received from [place], [place]   Physical Activity    Physical Activity: 0  Stress: Not on File (09/07/2021)   Received from [facility], [place]   Stress    Stress: 0  Social Connections: Not on File (09/07/2021)   Received from [place], [place]   Social Connections    Social Connections and Isolation: 0  Intimate Partner Violence: Not on file    Review of Systems  All other systems reviewed and are negative.       Objective  BP 108/72 (BP Location: Left Arm, Patient Position: Sitting, Cuff Size: Large)   Pulse 81   Temp 98.3 F (36.8 C) (Oral)   Ht 5' 10.5" (1.791 m)   Wt 222 lb 8 oz (100.9 kg)   SpO2 98%   BMI 31.47 kg/m   Physical Exam Vitals reviewed.  Constitutional:      Appearance: Normal appearance. He is well-groomed and normal weight.  HENT:     Head: Normocephalic and atraumatic.  Eyes:     Extraocular Movements: Extraocular movements intact.     Conjunctiva/sclera: Conjunctivae normal.     Pupils: Pupils are equal, round, and reactive to light.  Cardiovascular:     Rate and Rhythm: Normal rate and regular rhythm.     Pulses: Normal pulses.     Heart sounds: S1 normal and S2 normal. No murmur heard. Pulmonary:     Effort: Pulmonary effort  is normal.     Breath sounds: Normal breath sounds and air entry. No rales.  Abdominal:     General: Abdomen is flat. Bowel sounds are normal.     Palpations: Abdomen is soft.     Tenderness: There is no abdominal tenderness.  Musculoskeletal:     Right lower leg: No edema.     Left lower leg: No edema.  Neurological:     General: No focal deficit present.     Mental Status: He is alert and oriented to person, place, and time.     Gait: Gait is intact.     Deep Tendon Reflexes: Reflexes are normal and symmetric.  Psychiatric:        Mood and Affect: Mood and affect normal.         Assessment & Plan:  Heart palpitations Assessment & Plan: Normal rate and rhythm on auscultation today, likely due to albuterol use, will order labs to rule out other causes.   Orders: -     Comprehensive metabolic panel -     TSH -     CBC with Differential/Platelet  Moderate persistent asthma without complication Assessment & Plan: Concern is that his asthma worsens with exercise, I recommended starting a daily controller medication to help with his breathing, rx for symbicort sent to pharmacy.   Orders: -     Budesonide-Formoterol Fumarate; Inhale 2 puffs into the lungs in the morning and at bedtime.  Dispense: 1 each; Refill: 5    Return in about 1 year (around 12/12/2023) for annual physical exam.   Karie Georges, MD

## 2023-02-02 ENCOUNTER — Telehealth: Payer: Medicaid Other | Admitting: Family Medicine

## 2023-02-02 DIAGNOSIS — K649 Unspecified hemorrhoids: Secondary | ICD-10-CM

## 2023-02-02 MED ORDER — HYDROCORTISONE ACETATE 25 MG RE SUPP: 25.0000 mg | Freq: Two times a day (BID) | RECTAL | 0 refills | Status: DC

## 2023-02-02 NOTE — Progress Notes (Signed)

## 2023-02-03 ENCOUNTER — Other Ambulatory Visit: Payer: Self-pay

## 2023-02-03 ENCOUNTER — Encounter (HOSPITAL_COMMUNITY): Payer: Self-pay

## 2023-02-03 ENCOUNTER — Emergency Department (HOSPITAL_COMMUNITY)
Admission: EM | Admit: 2023-02-03 | Discharge: 2023-02-03 | Disposition: A | Discharge status: Home / Self Care | Payer: Medicaid Other | Attending: Emergency Medicine | Admitting: Emergency Medicine

## 2023-02-03 DIAGNOSIS — L0231 Cutaneous abscess of buttock: Secondary | ICD-10-CM | POA: Insufficient documentation

## 2023-02-03 LAB — CBC WITH DIFFERENTIAL/PLATELET
Abs Immature Granulocytes: 0.05 10*3/uL (ref 0.00–0.07)
Basophils Absolute: 0 10*3/uL (ref 0.0–0.1)
Basophils Relative: 0 %
Eosinophils Absolute: 0.1 10*3/uL (ref 0.0–0.5)
Eosinophils Relative: 1 %
HCT: 41.2 % (ref 39.0–52.0)
Hemoglobin: 12.9 g/dL — ABNORMAL LOW (ref 13.0–17.0)
Immature Granulocytes: 1 %
Lymphocytes Relative: 18 %
Lymphs Abs: 1.9 10*3/uL (ref 0.7–4.0)
MCH: 26 pg (ref 26.0–34.0)
MCHC: 31.3 g/dL (ref 30.0–36.0)
MCV: 82.9 fL (ref 80.0–100.0)
Monocytes Absolute: 1 10*3/uL (ref 0.1–1.0)
Monocytes Relative: 10 %
Neutro Abs: 7.3 10*3/uL (ref 1.7–7.7)
Neutrophils Relative %: 70 %
Platelets: 409 10*3/uL — ABNORMAL HIGH (ref 150–400)
RBC: 4.97 MIL/uL (ref 4.22–5.81)
RDW: 13.2 % (ref 11.5–15.5)
WBC: 10.4 10*3/uL (ref 4.0–10.5)
nRBC: 0 % (ref 0.0–0.2)

## 2023-02-03 MED ORDER — NIFEDIPINE 0.3 % OINTMENT: 1.0000 | TOPICAL_OINTMENT | Freq: Four times a day (QID) | CUTANEOUS | 0 refills | Status: AC

## 2023-02-03 MED ORDER — SULFAMETHOXAZOLE-TRIMETHOPRIM 800-160 MG PO TABS: 1.0000 | ORAL_TABLET | Freq: Two times a day (BID) | ORAL | 0 refills | Status: AC

## 2023-02-03 MED ORDER — NIFEDIPINE 0.3 % OINTMENT: 1.0000 | TOPICAL_OINTMENT | Freq: Four times a day (QID) | CUTANEOUS | 0 refills | Status: DC

## 2023-02-03 NOTE — ED Provider Notes (Signed)
EMERGENCY DEPARTMENT AT [facility] Provider Note   CSN: [id number] Arrival date & time: 02/03/23  1325     History  Chief Complaint  Patient presents with   Hemorrhoids   Rectal Bleeding    Scott Becker is a 27 y.o. male with past medical history asthma presents to the ED complaining of a hemorrhoid that he noticed 4 days ago.  States that he has happened before but this time it has been bleeding and that has never happened before.  Notes that it has been bleeding every time he has a bowel movement.  States that the last time he had a bowel movement he had an episode of bright red blood that filled up the toilet bowl and has had intermittent rectal bleeding since then.  No history of bleeding disorders.  Does not take anticoagulants.  Denies abdominal pain, fever, nausea, vomiting, diarrhea, constipation, or other symptoms.  No history of significant anemia requiring transfusion.       Home Medications Prior to Admission medications   Medication Sig Start Date End Date Taking? Authorizing Provider  sulfamethoxazole-trimethoprim (BACTRIM DS) 800-160 MG tablet Take 1 tablet by mouth 2 (two) times daily for 5 days. 02/03/23 02/08/23 Yes Denyla Cortese L, PA-C  albuterol (VENTOLIN HFA) 108 (90 Base) MCG/ACT inhaler Inhale 2 puffs into the lungs every 4 (four) hours as needed for wheezing or shortness of breath. 11/06/22   Garrison, [place] N, FNP  budesonide-formoterol [facility]) 160-4.5 MCG/ACT inhaler Inhale 2 puffs into the lungs in the morning and at bedtime. 12/12/22   Karie Georges, MD  hydrocortisone (ANUSOL-HC) 25 MG suppository Place 1 suppository (25 mg total) rectally 2 (two) times daily. 02/02/23   Delorse Lek, FNP  nifedipine 0.3 % ointment Place 1 Application rectally 4 (four) times daily for 7 days. 02/03/23 02/10/23  Yoshika Vensel, Lawrence Marse[place], PA-C      Allergies    Patient has no known allergies.    Review of Systems   Review of Systems  All  other systems reviewed and are negative.   Physical Exam Updated Vital Signs BP 131/83   Pulse 89   Temp 98.8 F (37.1 C)   Resp 16   Ht 5\' 11"  (1.803 m)   Wt 95.3 kg   SpO2 100%   BMI 29.29 kg/m  Physical Exam Vitals and nursing note reviewed.  Constitutional:      General: He is not in acute distress.    Appearance: Normal appearance.  HENT:     Head: Normocephalic and atraumatic.     Mouth/Throat:     Mouth: Mucous membranes are moist.  Eyes:     Conjunctiva/sclera: Conjunctivae normal.  Cardiovascular:     Rate and Rhythm: Normal rate and regular rhythm.     Heart sounds: No murmur heard. Pulmonary:     Effort: Pulmonary effort is normal.     Breath sounds: Normal breath sounds.  Abdominal:     General: Abdomen is flat. There is no distension.     Palpations: Abdomen is soft.     Tenderness: There is no abdominal tenderness. There is no guarding or rebound.  Genitourinary:    Comments: Exam completed with Chloe, primary RN, as chaperone, no external hemorrhoids or rectal bleeding visualized, no anal fissure, patient has a ~3cm irregularly round lesion to the mid-right buttock without anorectal involvement that is draining copious amounts of foul smelling bloody purulent drainage, no surrounding erythema, increased warmth, or induration, abscess  is soft and seems to be draining well Musculoskeletal:        General: Normal range of motion.     Cervical back: Neck supple.     Right lower leg: No edema.     Left lower leg: No edema.  Skin:    General: Skin is warm and dry.     Capillary Refill: Capillary refill takes less than 2 seconds.  Neurological:     Mental Status: He is alert. Mental status is at baseline.  Psychiatric:        Behavior: Behavior normal.     ED Results / Procedures / Treatments   Labs (all labs ordered are listed, but only abnormal results are displayed) Labs Reviewed  CBC WITH DIFFERENTIAL/PLATELET - Abnormal; Notable for the following  components:      Result Value   Hemoglobin 12.9 (*)    Platelets 409 (*)    All other components within normal limits    EKG None  Radiology No results found.  Procedures Procedures    Medications Ordered in ED Medications - No data to display  ED Course/ Medical Decision Making/ A&P                                 Medical Decision Making Amount and/or Complexity of Data Reviewed Labs: ordered. Decision-making details documented in ED Course.  Risk Prescription drug management.   Medical Decision Making:   AIKEEM BRONSTEIN is a 27 y.o. male who presented to the ED today with rectal pain detailed above.     Complete initial physical exam performed, notably the patient was well appearing, abdomen soft and nontender.    Reviewed and confirmed nursing documentation for past medical history, family history, social history.    Initial Assessment:   With the patient's presentation, differential diagnosis includes but is not limited to external thrombosed vs nonthrombosed hemorrhoids, upper/lower GI bleed, blood loss anemia.  This is most consistent with an acute complicated illness  Initial Plan:  Screening labs including CBC to assess blood counts Objective evaluation as below reviewed   Initial Study Results:   Laboratory  All laboratory results reviewed without evidence of clinically relevant pathology.   Exceptions include: Hgb 12.9     Final Assessment and Plan:   27 year old male presents to ED c/o rectal bleeding suspected to be secondary to hemorrhoid. Initially complained of large amount of bleeding so CBC obtained which showed stable hemoglobin at 12.9. Rectal exam then completed with primary RN. No signs of external hemorrhoid, anal fissure, or rectal abnormalities though pt did have decent sized abscess to buttock draining purulent, bloody, foul smelling discharge consistent with an abscess. No surrounding cellulitis. Abscess is draining well. Discussed  findings with pt and will cover with antibiotics. Pt will follow up with PCP as needed. Strict ED return precautions given, all questions answered, and stable for discharge.     Clinical Impression:  1. Abscess of right buttock      Discharge           Final Clinical Impression(s) / ED Diagnoses Final diagnoses:  Abscess of right buttock    Rx / DC Orders ED Discharge Orders          Ordered    nifedipine 0.3 % ointment  4 times daily,   Status:  Discontinued        02/03/23 1423    nifedipine 0.3 %  ointment  4 times daily        02/03/23 1451    sulfamethoxazole-trimethoprim (BACTRIM DS) 800-160 MG tablet  2 times daily        02/03/23 1453              Richardson Dopp 02/03/23 1458    Wynetta Fines, MD 02/03/23 [phone]

## 2023-02-03 NOTE — ED Triage Notes (Signed)
Pt c.o hemorrhoid since Wednesday but it starting bleeding yesterday with bowel movements. Pt concerned due to seeing a toilet full of bright red blood

## 2023-02-03 NOTE — Discharge Instructions (Addendum)
Thank you for letting us take care of you today.  You actually have an abscess to your buttock. It is draining bloody pus. Keep doing sitz baths or warm compresses to help it remain open and draining.  I recommend doing these every 4-6 hours.  It is important to take the antibiotics that we will treat the infection.  I prescribed Bactrim which we will treat for the common bugs that cause abscesses.  I did previously send in a prescription for nifedipine cream which treats hemorrhoids.  I do not see any hemorrhoids on your exam so you do not need to pick up this medication.  Follow-up with your PCP next week as needed if you continue to have symptoms or the abscess does not improve on antibiotics.  If you develop worsening symptoms such as redness or warmth around the abscess, fever, body aches, vomiting, or other worsening condition, return to the nearest ED for reevaluation.

## 2023-06-29 ENCOUNTER — Encounter: Payer: Self-pay | Admitting: *Deleted

## 2023-06-29 ENCOUNTER — Ambulatory Visit
Admission: EM | Admit: 2023-06-29 | Discharge: 2023-06-29 | Disposition: A | Discharge status: Home / Self Care | Payer: Medicaid Other | Attending: Internal Medicine | Admitting: Internal Medicine

## 2023-06-29 DIAGNOSIS — H6122 Impacted cerumen, left ear: Secondary | ICD-10-CM

## 2023-06-29 NOTE — ED Triage Notes (Signed)
 Left ear "blocked" x 3-4 days. Denies pain, fever, and sx of illness.

## 2023-06-29 NOTE — Discharge Instructions (Addendum)
 Left ear irrigated today and earwax removed.  Avoid using Q-tips in the ear as this normally pushes the earwax deeper Can use over-the-counter earwax remover such as Debrox Return to urgent care or PCP if symptoms worsen or fail to resolve.

## 2023-06-29 NOTE — ED Provider Notes (Signed)
 [facility]    CSN: [id number] Arrival date & time: 06/29/23  1028      History   Chief Complaint Chief Complaint  Patient presents with   Ear Fullness    HPI Scott Becker is a 28 y.o. male.   28 year old male who presents urgent care with complaints of hearing loss in the left ear.  This happened about 3 to 4 days ago.  He tried to get earwax out with a Q-tip but feels like he probably pushed it back further.  He denies any issues with the right ear.  He denies any pain, fevers, chills, recent illness.   Ear Fullness Pertinent negatives include no chest pain, no abdominal pain and no shortness of breath.    Past Medical History:  Diagnosis Date   Asthma     Patient Active Problem List   Diagnosis Date Noted   Heart palpitations 12/12/2022   Moderate persistent asthma without complication 12/12/2022   Mild intermittent asthma 08/30/2008   SHORTNESS OF BREATH 08/30/2008    History reviewed. No pertinent surgical history.     Home Medications    Prior to Admission medications   Medication Sig Start Date End Date Taking? Authorizing Provider  albuterol  (VENTOLIN  HFA) 108 (90 Base) MCG/ACT inhaler Inhale 2 puffs into the lungs every 4 (four) hours as needed for wheezing or shortness of breath. 11/06/22  Yes Garrison, [place]  N, FNP  budesonide -formoterol  (SYMBICORT ) 160-4.5 MCG/ACT inhaler Inhale 2 puffs into the lungs in the morning and at bedtime. 12/12/22  Yes Ozell Heron HERO, MD  hydrocortisone  (ANUSOL -HC) 25 MG suppository Place 1 suppository (25 mg total) rectally 2 (two) times daily. 02/02/23   Blair, Diane W, FNP    Family History Family History  Problem Relation Age of Onset   Diabetes Sister    Asthma Maternal Grandmother     Social History Social History   Tobacco Use   Smoking status: Never   Smokeless tobacco: Never  Vaping Use   Vaping status: Never Used  Substance Use Topics   Alcohol use: Yes    Comment: minimal    Drug use: Not Currently    Types: Marijuana     Allergies   Patient has no known allergies.   Review of Systems Review of Systems  Constitutional:  Negative for chills and fever.  HENT:  Positive for hearing loss. Negative for ear pain and sore throat.   Eyes:  Negative for pain and visual disturbance.  Respiratory:  Negative for cough and shortness of breath.   Cardiovascular:  Negative for chest pain and palpitations.  Gastrointestinal:  Negative for abdominal pain and vomiting.  Genitourinary:  Negative for dysuria and hematuria.  Musculoskeletal:  Negative for arthralgias and back pain.  Skin:  Negative for color change and rash.  Neurological:  Negative for seizures and syncope.  All other systems reviewed and are negative.    Physical Exam Triage Vital Signs ED Triage Vitals  Encounter Vitals Group     BP 06/29/23 1123 118/79     Systolic BP Percentile --      Diastolic BP Percentile --      Pulse Rate 06/29/23 1123 84     Resp 06/29/23 1123 16     Temp 06/29/23 1123 98.1 F (36.7 C)     Temp Source 06/29/23 1123 Oral     SpO2 06/29/23 1123 97 %     Weight --      Height --  Head Circumference --      Peak Flow --      Pain Score 06/29/23 1121 0     Pain Loc --      Pain Education --      Exclude from Growth Chart --    No data found.  Updated Vital Signs BP 118/79 (BP Location: Left Arm)   Pulse 84   Temp 98.1 F (36.7 C) (Oral)   Resp 16   SpO2 97%   Visual Acuity Right Eye Distance:   Left Eye Distance:   Bilateral Distance:    Right Eye Near:   Left Eye Near:    Bilateral Near:     Physical Exam Vitals and nursing note reviewed.  Constitutional:      General: He is not in acute distress.    Appearance: He is well-developed.  HENT:     Head: Normocephalic and atraumatic.     Right Ear: Tympanic membrane normal.     Left Ear: There is impacted cerumen.     Ears:     Comments: Left eardrum is clear and hearing restored after  irrigation    Nose: Nose normal.     Mouth/Throat:     Mouth: Mucous membranes are moist.  Eyes:     Conjunctiva/sclera: Conjunctivae normal.  Cardiovascular:     Rate and Rhythm: Normal rate and regular rhythm.     Heart sounds: No murmur heard. Pulmonary:     Effort: Pulmonary effort is normal. No respiratory distress.     Breath sounds: Normal breath sounds.  Abdominal:     Palpations: Abdomen is soft.     Tenderness: There is no abdominal tenderness.  Musculoskeletal:        General: No swelling.     Cervical back: Neck supple.  Skin:    General: Skin is warm and dry.     Capillary Refill: Capillary refill takes less than 2 seconds.  Neurological:     Mental Status: He is alert.  Psychiatric:        Mood and Affect: Mood normal.      UC Treatments / Results  Labs (all labs ordered are listed, but only abnormal results are displayed) Labs Reviewed - No data to display  EKG   Radiology No results found.  Procedures Procedures (including critical care time)  Medications Ordered in UC Medications - No data to display  Initial Impression / Assessment and Plan / UC Course  I have reviewed the triage vital signs and the nursing notes.  Pertinent labs & imaging results that were available during my care of the patient were reviewed by me and considered in my medical decision making (see chart for details).     Impacted cerumen of left ear   Left ear irrigated today and earwax removed.  Avoid using Q-tips in the ear as this normally pushes the earwax deeper Can use over-the-counter earwax remover such as Debrox Return to urgent care or PCP if symptoms worsen or fail to resolve.    Final Clinical Impressions(s) / UC Diagnoses   Final diagnoses:  Impacted cerumen of left ear   Discharge Instructions   None    ED Prescriptions   None    PDMP not reviewed this encounter.   Teresa Almarie LABOR, PA-C 06/29/23 1210

## 2023-10-01 ENCOUNTER — Telehealth: Admitting: Physician Assistant

## 2023-10-01 DIAGNOSIS — Z760 Encounter for issue of repeat prescription: Secondary | ICD-10-CM

## 2023-10-01 DIAGNOSIS — Z91199 Patient's noncompliance with other medical treatment and regimen due to unspecified reason: Secondary | ICD-10-CM

## 2023-10-01 NOTE — Progress Notes (Signed)
   Thank you for the details you included in the comment boxes. Those details are very helpful in determining the best course of treatment for you and help us  to provide the best care. Because of needing medication refills of a chronic medication, we recommend that you schedule a Virtual Urgent Care video visit in order for the provider to better assess what is going on.  The provider will be able to give you a more accurate diagnosis and treatment plan if we can more freely discuss your symptoms and with the addition of a virtual examination.   If you change your visit to a video visit, we will bill your insurance (similar to an office visit) and you will not be charged for this e-Visit. You will be able to stay at home and speak with the first available [facility] advanced practice provider. The link to do a video visit is in the drop down Menu tab of your Welcome screen in MyChart.        I have spent 5 minutes in review of e-visit questionnaire, review and updating patient chart, medical decision making and response to patient.   Angelia Kelp, PA-C

## 2023-10-01 NOTE — Progress Notes (Signed)
 The patient no-showed for appointment despite this provider sending direct link x 2 with no response and waiting for at least 10 minutes from appointment time for patient to join. They will be marked as a NS for this appointment/time.   Hyla Maillard, PA-C

## 2023-11-21 ENCOUNTER — Encounter (HOSPITAL_COMMUNITY): Payer: Self-pay

## 2023-11-21 ENCOUNTER — Ambulatory Visit (HOSPITAL_COMMUNITY)
Admission: EM | Admit: 2023-11-21 | Discharge: 2023-11-21 | Disposition: A | Discharge status: Home / Self Care | Attending: Internal Medicine | Admitting: Internal Medicine

## 2023-11-21 DIAGNOSIS — H60332 Swimmer's ear, left ear: Secondary | ICD-10-CM | POA: Diagnosis not present

## 2023-11-21 DIAGNOSIS — H6123 Impacted cerumen, bilateral: Secondary | ICD-10-CM | POA: Diagnosis not present

## 2023-11-21 MED ORDER — CIPROFLOXACIN-DEXAMETHASONE 0.3-0.1 % OT SUSP: 4.0000 [drp] | Freq: Two times a day (BID) | OTIC | 0 refills | Status: AC

## 2023-11-21 NOTE — ED Provider Notes (Signed)
 [facility]    CSN: [id number] Arrival date & time: 11/21/23  1941      History   Chief Complaint Chief Complaint  Patient presents with   ear clogged    HPI Scott Becker is a 28 y.o. male.   28 year old male who presents urgent care with complaints of clogged left ear.  He reports that this started yesterday.  He tried to clean it out with a Q-tip and made it worse.  He denies any fevers or chills.  He has had some mild loss of hearing.  He reports this has happened before.  He usually only has problems with his left ear.     Past Medical History:  Diagnosis Date   Asthma     Patient Active Problem List   Diagnosis Date Noted   Heart palpitations 12/12/2022   Moderate persistent asthma without complication 12/12/2022   Mild intermittent asthma 08/30/2008   SHORTNESS OF BREATH 08/30/2008    History reviewed. No pertinent surgical history.     Home Medications    Prior to Admission medications   Medication Sig Start Date End Date Taking? Authorizing Provider  ciprofloxacin-dexamethasone  (CIPRODEX) OTIC suspension Place 4 drops into the left ear 2 (two) times daily for 10 days. 11/21/23 12/01/23 Yes Jesika Men A, PA-C  albuterol  (VENTOLIN  HFA) 108 (90 Base) MCG/ACT inhaler Inhale 2 puffs into the lungs every 4 (four) hours as needed for wheezing or shortness of breath. 11/06/22   Dreama, [place]  N, FNP  budesonide -formoterol  (SYMBICORT ) 160-4.5 MCG/ACT inhaler Inhale 2 puffs into the lungs in the morning and at bedtime. 12/12/22   Ozell Heron HERO, MD    Family History Family History  Problem Relation Age of Onset   Diabetes Sister    Asthma Maternal Grandmother     Social History Social History   Tobacco Use   Smoking status: Never   Smokeless tobacco: Never  Vaping Use   Vaping status: Never Used  Substance Use Topics   Alcohol use: Yes    Comment: minimal   Drug use: Not Currently    Types: Marijuana     Allergies    Patient has no known allergies.   Review of Systems Review of Systems  Constitutional:  Negative for chills and fever.  HENT:  Positive for hearing loss. Negative for ear pain and sore throat.   Eyes:  Negative for pain and visual disturbance.  Respiratory:  Negative for cough and shortness of breath.   Cardiovascular:  Negative for chest pain and palpitations.  Gastrointestinal:  Negative for abdominal pain and vomiting.  Genitourinary:  Negative for dysuria and hematuria.  Musculoskeletal:  Negative for arthralgias and back pain.  Skin:  Negative for color change and rash.  Neurological:  Negative for seizures and syncope.  All other systems reviewed and are negative.    Physical Exam Triage Vital Signs ED Triage Vitals  Encounter Vitals Group     BP 11/21/23 1954 128/72     Girls Systolic BP Percentile --      Girls Diastolic BP Percentile --      Boys Systolic BP Percentile --      Boys Diastolic BP Percentile --      Pulse Rate 11/21/23 1954 86     Resp 11/21/23 1954 16     Temp 11/21/23 1954 98.1 F (36.7 C)     Temp Source 11/21/23 1954 Oral     SpO2 11/21/23 1954 93 %  Weight --      Height --      Head Circumference --      Peak Flow --      Pain Score 11/21/23 1955 0     Pain Loc --      Pain Education --      Exclude from Growth Chart --    No data found.  Updated Vital Signs BP 128/72 (BP Location: Right Arm)   Pulse 86   Temp 98.1 F (36.7 C) (Oral)   Resp 16   SpO2 93%   Visual Acuity Right Eye Distance:   Left Eye Distance:   Bilateral Distance:    Right Eye Near:   Left Eye Near:    Bilateral Near:     Physical Exam Vitals and nursing note reviewed.  Constitutional:      General: He is not in acute distress.    Appearance: He is well-developed.  HENT:     Head: Normocephalic and atraumatic.     Right Ear: Decreased hearing noted. There is impacted cerumen.     Left Ear: Decreased hearing noted. There is impacted cerumen.      Ears:     Comments: After irrigation the ear canals are clear however the left ear canal is erythematous and edematous Eyes:     Conjunctiva/sclera: Conjunctivae normal.  Cardiovascular:     Rate and Rhythm: Normal rate and regular rhythm.  Pulmonary:     Effort: Pulmonary effort is normal. No respiratory distress.  Abdominal:     Palpations: Abdomen is soft.  Musculoskeletal:        General: No swelling.     Cervical back: Neck supple.  Skin:    General: Skin is warm and dry.     Capillary Refill: Capillary refill takes less than 2 seconds.  Neurological:     Mental Status: He is alert.  Psychiatric:        Mood and Affect: Mood normal.      UC Treatments / Results  Labs (all labs ordered are listed, but only abnormal results are displayed) Labs Reviewed - No data to display  EKG   Radiology No results found.  Procedures Procedures (including critical care time)  Medications Ordered in UC Medications - No data to display  Initial Impression / Assessment and Plan / UC Course  I have reviewed the triage vital signs and the nursing notes.  Pertinent labs & imaging results that were available during my care of the patient were reviewed by me and considered in my medical decision making (see chart for details).     Impacted cerumen of both ears  Acute swimmer's ear of left side   Bilateral ears were impacted.  Irrigation done bilaterally.  After irrigation the left ear canal is erythematous and edematous.  We will treat this with Ciprodex 4 drops in the left ear twice daily for 10 days.  I have recommended that the patient use Debrox over-the-counter to help prevent earwax buildup and to not use Q-tips in the ear. Return to urgent care or PCP if symptoms worsen or fail to resolve.    Final Clinical Impressions(s) / UC Diagnoses   Final diagnoses:  Impacted cerumen of both ears  Acute swimmer's ear of left side     Discharge Instructions      Bilateral  impacted ear canals with an infection in the left ear canal. We have irrigated both ears and the ear canals are clear after. We will  treat with infection with the following: CiproDex 4 drops in the left ear twice daily for 10 days. This is an antibiotic as well as an anti-inflammatory.  Can try over the counter debrox to help with earwax build up  Return to urgent care or PCP if symptoms worsen or fail to resolve.       ED Prescriptions     Medication Sig Dispense Auth. Provider   ciprofloxacin-dexamethasone  (CIPRODEX) OTIC suspension Place 4 drops into the left ear 2 (two) times daily for 10 days. 7.5 mL Teresa Almarie LABOR, [place]      PDMP not reviewed this encounter.   Teresa Almarie LABOR, [place] 11/21/23 2020

## 2023-11-21 NOTE — ED Notes (Signed)
Patient was discharged by this nurse 

## 2023-11-21 NOTE — ED Triage Notes (Signed)
 Pt c/o lt ear clogged since yesterday. States used a q-tip with no relief.

## 2023-11-21 NOTE — Discharge Instructions (Addendum)
 Bilateral impacted ear canals with an infection in the left ear canal. We have irrigated both ears and the ear canals are clear after. We will treat with infection with the following: CiproDex 4 drops in the left ear twice daily for 10 days. This is an antibiotic as well as an anti-inflammatory.  Can try over the counter debrox to help with earwax build up  Return to urgent care or PCP if symptoms worsen or fail to resolve.

## 2024-02-23 ENCOUNTER — Encounter: Payer: Self-pay | Admitting: Family Medicine

## 2024-02-23 DIAGNOSIS — J4540 Moderate persistent asthma, uncomplicated: Secondary | ICD-10-CM

## 2024-02-24 MED ORDER — BUDESONIDE-FORMOTEROL FUMARATE 160-4.5 MCG/ACT IN AERO: 2.0000 | INHALATION_SPRAY | Freq: Two times a day (BID) | RESPIRATORY_TRACT | 0 refills | Status: DC

## 2024-06-03 ENCOUNTER — Encounter: Payer: Self-pay | Admitting: Family Medicine

## 2024-06-04 ENCOUNTER — Encounter: Payer: Self-pay | Admitting: *Deleted

## 2024-06-04 ENCOUNTER — Ambulatory Visit: Admission: EM | Admit: 2024-06-04 | Discharge: 2024-06-04 | Disposition: A | Discharge status: Home / Self Care

## 2024-06-04 DIAGNOSIS — J4540 Moderate persistent asthma, uncomplicated: Secondary | ICD-10-CM

## 2024-06-04 DIAGNOSIS — J45901 Unspecified asthma with (acute) exacerbation: Secondary | ICD-10-CM

## 2024-06-04 MED ORDER — METHYLPREDNISOLONE 4 MG PO TBPK: ORAL_TABLET | ORAL | 0 refills | Status: AC

## 2024-06-04 MED ORDER — IPRATROPIUM-ALBUTEROL 0.5-2.5 (3) MG/3ML IN SOLN
3.0000 mL | Freq: Once | RESPIRATORY_TRACT | Status: AC
  Administered 2024-06-04: 3 mL via RESPIRATORY_TRACT

## 2024-06-04 MED ORDER — SYMBICORT 160-4.5 MCG/ACT IN AERO: 2.0000 | INHALATION_SPRAY | Freq: Two times a day (BID) | RESPIRATORY_TRACT | 1 refills | Status: AC

## 2024-06-04 MED ORDER — ALBUTEROL SULFATE HFA 108 (90 BASE) MCG/ACT IN AERS: 2.0000 | INHALATION_SPRAY | RESPIRATORY_TRACT | 1 refills | Status: AC | PRN

## 2024-06-04 NOTE — ED Triage Notes (Signed)
 Pt reports SOB this morning. States he is out of his inhaler which he uses approx 3 times a week. Speaking full sentences. No active distress.

## 2024-06-04 NOTE — ED Provider Notes (Signed)
 " [facility]    CSN: [id number] Arrival date & time: 06/04/24  0813      History   Chief Complaint Chief Complaint  Patient presents with   Shortness of Breath    HPI BRICK Scott Becker is a 29 y.o. male.   Pt presents today due to shortness of breath and chest tightness since last night. Pt denies cough or dizziness. Pt states that this feels like his typical asthma symptoms and he has been out of his inhaler for 3 weeks.   The history is provided by the patient.  Shortness of Breath   Past Medical History:  Diagnosis Date   Asthma     Patient Active Problem List   Diagnosis Date Noted   Heart palpitations 12/12/2022   Moderate persistent asthma without complication 12/12/2022   Mild intermittent asthma 08/30/2008   SHORTNESS OF BREATH 08/30/2008    History reviewed. No pertinent surgical history.     Home Medications    Prior to Admission medications  Medication Sig Start Date End Date Taking? Authorizing Provider  albuterol  (VENTOLIN  HFA) 108 (90 Base) MCG/ACT inhaler Inhale 2 puffs into the lungs every 4 (four) hours as needed for wheezing or shortness of breath. 06/04/24  Yes Andra Krabbe C, PA-C  budesonide -formoterol  (SYMBICORT ) 160-4.5 MCG/ACT inhaler Inhale 2 puffs into the lungs in the morning and at bedtime. 06/04/24  Yes Andra Krabbe BROCKS, PA-C  methylPREDNISolone  (MEDROL  DOSEPAK) 4 MG TBPK tablet Take as directed on back of package 06/04/24  Yes Andra Krabbe BROCKS, PA-C    Family History Family History  Problem Relation Age of Onset   Diabetes Sister    Asthma Maternal Grandmother     Social History Social History[1]   Allergies   Patient has no known allergies.   Review of Systems Review of Systems  Respiratory:  Positive for shortness of breath.      Physical Exam Triage Vital Signs ED Triage Vitals  Encounter Vitals Group     BP 06/04/24 0841 (!) 130/93     Girls Systolic BP Percentile --      Girls  Diastolic BP Percentile --      Boys Systolic BP Percentile --      Boys Diastolic BP Percentile --      Pulse Rate 06/04/24 0841 (!) 103     Resp 06/04/24 0841 16     Temp 06/04/24 0841 98.1 F (36.7 C)     Temp Source 06/04/24 0841 Oral     SpO2 06/04/24 0841 96 %     Weight --      Height --      Head Circumference --      Peak Flow --      Pain Score 06/04/24 0840 4     Pain Loc --      Pain Education --      Exclude from Growth Chart --    No data found.  Updated Vital Signs BP (!) 130/93 (BP Location: Right Arm)   Pulse (!) 103   Temp 98.1 F (36.7 C) (Oral)   Resp 16   SpO2 96%   Visual Acuity Right Eye Distance:   Left Eye Distance:   Bilateral Distance:    Right Eye Near:   Left Eye Near:    Bilateral Near:     Physical Exam Vitals and nursing note reviewed.  Constitutional:      General: He is not in acute distress.    Appearance:  Normal appearance. He is not ill-appearing, toxic-appearing or diaphoretic.  Eyes:     General: No scleral icterus. Cardiovascular:     Rate and Rhythm: Normal rate and regular rhythm.     Heart sounds: Normal heart sounds.  Pulmonary:     Effort: Pulmonary effort is normal. No respiratory distress.     Breath sounds: Normal breath sounds. No wheezing or rhonchi.  Skin:    General: Skin is warm.  Neurological:     Mental Status: He is alert and oriented to person, place, and time.  Psychiatric:        Mood and Affect: Mood normal.        Behavior: Behavior normal.      UC Treatments / Results  Labs (all labs ordered are listed, but only abnormal results are displayed) Labs Reviewed - No data to display  EKG   Radiology No results found.  Procedures Procedures (including critical care time)  Medications Ordered in UC Medications  ipratropium-albuterol  (DUONEB) 0.5-2.5 (3) MG/3ML nebulizer solution 3 mL (3 mLs Nebulization Given 06/04/24 0858)    Initial Impression / Assessment and Plan / UC Course  I  have reviewed the triage vital signs and the nursing notes.  Pertinent labs & imaging results that were available during my care of the patient were reviewed by me and considered in my medical decision making (see chart for details).     Final Clinical Impressions(s) / UC Diagnoses   Final diagnoses:  Asthma with acute exacerbation, unspecified asthma severity, unspecified whether persistent     Discharge Instructions      It appears that you are supposed to be using Symbicort  daily to maintain asthma and albuterol  as needed for rescue. I have sent both inhalers to pharmacy in addition to steroid dosepack.      ED Prescriptions     Medication Sig Dispense Auth. Provider   methylPREDNISolone  (MEDROL  DOSEPAK) 4 MG TBPK tablet Take as directed on back of package 21 tablet Andra Krabbe C, PA-C   albuterol  (VENTOLIN  HFA) 108 (90 Base) MCG/ACT inhaler Inhale 2 puffs into the lungs every 4 (four) hours as needed for wheezing or shortness of breath. 18 g Tressie Ragin C, PA-C   budesonide -formoterol  (SYMBICORT ) 160-4.5 MCG/ACT inhaler Inhale 2 puffs into the lungs in the morning and at bedtime. 30.6 g Andra Krabbe BROCKS, PA-C      PDMP not reviewed this encounter.    [1]  Social History Tobacco Use   Smoking status: Never   Smokeless tobacco: Never  Vaping Use   Vaping status: Never Used  Substance Use Topics   Alcohol use: Yes    Comment: minimal   Drug use: Not Currently    Types: Marijuana     Andra Krabbe BROCKS, PA-C 06/04/24 9090  "

## 2024-06-04 NOTE — Discharge Instructions (Addendum)
 It appears that you are supposed to be using Symbicort  daily to maintain asthma and albuterol  as needed for rescue. I have sent both inhalers to pharmacy in addition to steroid dosepack.
